# Patient Record
Sex: Female | Born: 1945
Health system: Southern US, Community
[De-identification: ages and names within clinical notes are randomized; demographics above are authoritative.]

## PROBLEM LIST (undated history)

## (undated) DIAGNOSIS — I10 Essential (primary) hypertension: Secondary | ICD-10-CM

## (undated) DIAGNOSIS — C50919 Malignant neoplasm of unspecified site of unspecified female breast: Secondary | ICD-10-CM

## (undated) DIAGNOSIS — M810 Age-related osteoporosis without current pathological fracture: Secondary | ICD-10-CM

## (undated) HISTORY — DX: Essential (primary) hypertension: I10

## (undated) HISTORY — PX: EYE SURGERY: SHX253

## (undated) HISTORY — DX: Age-related osteoporosis without current pathological fracture: M81.0

## (undated) HISTORY — PX: COLONOSCOPY: SHX174

## (undated) HISTORY — PX: TUBAL LIGATION: SHX77

---

## 1995-08-09 DIAGNOSIS — C50919 Malignant neoplasm of unspecified site of unspecified female breast: Secondary | ICD-10-CM

## 1995-08-09 HISTORY — DX: Malignant neoplasm of unspecified site of unspecified female breast: C50.919

## 1995-08-09 HISTORY — PX: MASTECTOMY: SHX3

## 1998-08-05 ENCOUNTER — Other Ambulatory Visit: Admission: RE | Admit: 1998-08-05 | Discharge: 1998-08-05 | Payer: Self-pay | Admitting: Obstetrics and Gynecology

## 1999-02-05 ENCOUNTER — Encounter: Payer: Self-pay | Admitting: Family Medicine

## 1999-02-05 ENCOUNTER — Ambulatory Visit (HOSPITAL_COMMUNITY): Admission: RE | Admit: 1999-02-05 | Discharge: 1999-02-05 | Payer: Self-pay | Admitting: Family Medicine

## 1999-09-28 ENCOUNTER — Encounter: Admission: RE | Admit: 1999-09-28 | Discharge: 1999-09-28 | Payer: Self-pay | Admitting: Family Medicine

## 1999-09-28 ENCOUNTER — Encounter: Payer: Self-pay | Admitting: Family Medicine

## 1999-10-14 ENCOUNTER — Other Ambulatory Visit: Admission: RE | Admit: 1999-10-14 | Discharge: 1999-10-14 | Payer: Self-pay | Admitting: Obstetrics and Gynecology

## 1999-10-14 ENCOUNTER — Encounter (INDEPENDENT_AMBULATORY_CARE_PROVIDER_SITE_OTHER): Payer: Self-pay | Admitting: Specialist

## 2000-07-13 ENCOUNTER — Encounter (INDEPENDENT_AMBULATORY_CARE_PROVIDER_SITE_OTHER): Payer: Self-pay

## 2000-07-13 ENCOUNTER — Ambulatory Visit (HOSPITAL_COMMUNITY): Admission: RE | Admit: 2000-07-13 | Discharge: 2000-07-13 | Payer: Self-pay | Admitting: Obstetrics and Gynecology

## 2000-10-23 ENCOUNTER — Encounter: Payer: Self-pay | Admitting: Oncology

## 2000-10-23 ENCOUNTER — Encounter: Admission: RE | Admit: 2000-10-23 | Discharge: 2000-10-23 | Payer: Self-pay | Admitting: Oncology

## 2000-12-11 ENCOUNTER — Encounter (INDEPENDENT_AMBULATORY_CARE_PROVIDER_SITE_OTHER): Payer: Self-pay | Admitting: Specialist

## 2000-12-11 ENCOUNTER — Other Ambulatory Visit: Admission: RE | Admit: 2000-12-11 | Discharge: 2000-12-11 | Payer: Self-pay | Admitting: Obstetrics and Gynecology

## 2001-07-10 ENCOUNTER — Other Ambulatory Visit: Admission: RE | Admit: 2001-07-10 | Discharge: 2001-07-10 | Payer: Self-pay | Admitting: Obstetrics and Gynecology

## 2001-10-29 ENCOUNTER — Encounter: Payer: Self-pay | Admitting: Obstetrics and Gynecology

## 2001-10-29 ENCOUNTER — Encounter: Admission: RE | Admit: 2001-10-29 | Discharge: 2001-10-29 | Payer: Self-pay | Admitting: Obstetrics and Gynecology

## 2002-01-08 ENCOUNTER — Other Ambulatory Visit: Admission: RE | Admit: 2002-01-08 | Discharge: 2002-01-08 | Payer: Self-pay | Admitting: Obstetrics and Gynecology

## 2002-07-11 ENCOUNTER — Other Ambulatory Visit: Admission: RE | Admit: 2002-07-11 | Discharge: 2002-07-11 | Payer: Self-pay | Admitting: Obstetrics and Gynecology

## 2002-11-11 ENCOUNTER — Encounter: Payer: Self-pay | Admitting: Obstetrics and Gynecology

## 2002-11-11 ENCOUNTER — Encounter: Admission: RE | Admit: 2002-11-11 | Discharge: 2002-11-11 | Payer: Self-pay | Admitting: Obstetrics and Gynecology

## 2003-01-13 ENCOUNTER — Other Ambulatory Visit: Admission: RE | Admit: 2003-01-13 | Discharge: 2003-01-13 | Payer: Self-pay | Admitting: Obstetrics and Gynecology

## 2003-11-24 ENCOUNTER — Encounter: Admission: RE | Admit: 2003-11-24 | Discharge: 2003-11-24 | Payer: Self-pay | Admitting: Family Medicine

## 2004-02-17 ENCOUNTER — Other Ambulatory Visit: Admission: RE | Admit: 2004-02-17 | Discharge: 2004-02-17 | Payer: Self-pay | Admitting: Obstetrics and Gynecology

## 2004-12-27 ENCOUNTER — Encounter: Admission: RE | Admit: 2004-12-27 | Discharge: 2004-12-27 | Payer: Self-pay | Admitting: Obstetrics and Gynecology

## 2005-02-21 ENCOUNTER — Other Ambulatory Visit: Admission: RE | Admit: 2005-02-21 | Discharge: 2005-02-21 | Payer: Self-pay | Admitting: Obstetrics and Gynecology

## 2006-01-09 ENCOUNTER — Encounter: Admission: RE | Admit: 2006-01-09 | Discharge: 2006-01-09 | Payer: Self-pay | Admitting: Obstetrics and Gynecology

## 2007-02-13 ENCOUNTER — Encounter: Admission: RE | Admit: 2007-02-13 | Discharge: 2007-02-13 | Payer: Self-pay | Admitting: Obstetrics and Gynecology

## 2008-03-25 ENCOUNTER — Encounter: Admission: RE | Admit: 2008-03-25 | Discharge: 2008-03-25 | Payer: Self-pay | Admitting: Obstetrics and Gynecology

## 2009-06-08 ENCOUNTER — Encounter: Admission: RE | Admit: 2009-06-08 | Discharge: 2009-06-08 | Payer: Self-pay | Admitting: Obstetrics and Gynecology

## 2009-08-08 HISTORY — PX: VAGINAL HYSTERECTOMY: SUR661

## 2009-10-09 ENCOUNTER — Ambulatory Visit (HOSPITAL_COMMUNITY): Admission: RE | Admit: 2009-10-09 | Discharge: 2009-10-09 | Payer: Self-pay | Admitting: Endocrinology

## 2010-01-20 ENCOUNTER — Encounter (INDEPENDENT_AMBULATORY_CARE_PROVIDER_SITE_OTHER): Payer: Self-pay | Admitting: Obstetrics and Gynecology

## 2010-01-20 ENCOUNTER — Ambulatory Visit (HOSPITAL_COMMUNITY): Admission: RE | Admit: 2010-01-20 | Discharge: 2010-01-22 | Payer: Self-pay | Admitting: Obstetrics and Gynecology

## 2010-01-25 ENCOUNTER — Inpatient Hospital Stay (HOSPITAL_COMMUNITY): Admission: AD | Admit: 2010-01-25 | Discharge: 2010-01-25 | Payer: Self-pay | Admitting: Obstetrics and Gynecology

## 2010-07-14 ENCOUNTER — Encounter
Admission: RE | Admit: 2010-07-14 | Discharge: 2010-07-14 | Payer: Self-pay | Source: Home / Self Care | Admitting: Obstetrics and Gynecology

## 2010-10-25 LAB — CBC
HCT: 32.3 % — ABNORMAL LOW (ref 36.0–46.0)
HCT: 38.9 % (ref 36.0–46.0)
Hemoglobin: 13.4 g/dL (ref 12.0–15.0)
Platelets: 184 10*3/uL (ref 150–400)
RBC: 3.6 MIL/uL — ABNORMAL LOW (ref 3.87–5.11)
WBC: 15.9 10*3/uL — ABNORMAL HIGH (ref 4.0–10.5)
WBC: 6.2 10*3/uL (ref 4.0–10.5)

## 2010-10-25 LAB — BASIC METABOLIC PANEL
BUN: 16 mg/dL (ref 6–23)
Chloride: 105 mEq/L (ref 96–112)
Glucose, Bld: 79 mg/dL (ref 70–99)
Potassium: 3.8 mEq/L (ref 3.5–5.1)

## 2010-12-24 NOTE — Op Note (Signed)
[facility]  Patient:    Katherine Stout, Katherine Stout                     MRN: [id number] Proc. Date: 07/13/00 Attending:  Juluis Mire, M.D.                           Operative Report  PREOPERATIVE DIAGNOSIS:       Endometrial polyp.  POSTOPERATIVE DIAGNOSIS:      Endometrial polyp.  OPERATIONS:                   1. Hysteroscopic resection.                               2. Dilatation and curettage.  SURGEON:                      Juluis Mire, M.D.  ANESTHESIA:                   LMA.  ESTIMATED BLOOD LOSS:         Minimal.  PACKS AND DRAINS:             None.  INTRAOPERATIVE BLOOD REPLACEMENT:                  None.  COMPLICATIONS:                None.  INDICATIONS:                  As dictated in the history and physical.  DESCRIPTION OF PROCEDURE:     The patient was taken to the OR and placed in the supine position.  After a satisfactory level of LMA anesthesia was obtained, the patient was placed in the dorsolithotomy position using the [employer] stirrups.  The perineum and vagina were prepped out with Betadine and draped as a sterile field.  Exam under anesthesia revealed the uterus to be posterior and normal size and shape.  The speculum was placed in the vaginal vault.  The cervix was grasped with a single-tooth tenaculum.  The uterus was sounded to 3 cm.  The cervix was dilated to a size 35 Pratt dilator.  The operative resectoscope was introduced.  Visualization revealed a large polyp extending from the right cornual area of the uterus.  This was resected in several steps and sent for pathological review.  We then obtained endometrial biopsies using the resectoscope both anterior and posterior and these were sent for pathological review.  No active bleeding was noted.  We then subsequently did endometrial curettings that were sent.  At the end of the procedure there was no active bleeding and no evidence of perforation or complications.   The hysteroscope, single-tooth tenaculum, and speculum were then removed.  The patient was taken out of the dorsosupine position once extubated and transferred to the recovery room in good condition.  The sponge, needle, and instrument counts were correct by the circulating nurse. DD:  07/13/00 TD:  07/13/00 Job: [id number]

## 2011-07-26 ENCOUNTER — Other Ambulatory Visit: Payer: Self-pay | Admitting: Obstetrics and Gynecology

## 2011-07-26 DIAGNOSIS — Z9011 Acquired absence of right breast and nipple: Secondary | ICD-10-CM

## 2011-07-26 DIAGNOSIS — Z1231 Encounter for screening mammogram for malignant neoplasm of breast: Secondary | ICD-10-CM

## 2011-09-07 ENCOUNTER — Ambulatory Visit
Admission: RE | Admit: 2011-09-07 | Discharge: 2011-09-07 | Disposition: A | Discharge status: Home / Self Care | Payer: Medicare Other | Source: Ambulatory Visit | Attending: Obstetrics and Gynecology | Admitting: Obstetrics and Gynecology

## 2011-09-07 DIAGNOSIS — Z1231 Encounter for screening mammogram for malignant neoplasm of breast: Secondary | ICD-10-CM

## 2011-09-07 DIAGNOSIS — Z9011 Acquired absence of right breast and nipple: Secondary | ICD-10-CM

## 2012-09-06 ENCOUNTER — Other Ambulatory Visit: Payer: Self-pay | Admitting: Obstetrics and Gynecology

## 2012-09-06 DIAGNOSIS — Z9011 Acquired absence of right breast and nipple: Secondary | ICD-10-CM

## 2012-09-06 DIAGNOSIS — Z1231 Encounter for screening mammogram for malignant neoplasm of breast: Secondary | ICD-10-CM

## 2012-10-11 ENCOUNTER — Ambulatory Visit: Payer: Medicare Other

## 2012-11-29 ENCOUNTER — Ambulatory Visit
Admission: RE | Admit: 2012-11-29 | Discharge: 2012-11-29 | Disposition: A | Discharge status: Home / Self Care | Payer: Medicare Other | Source: Ambulatory Visit | Attending: Obstetrics and Gynecology | Admitting: Obstetrics and Gynecology

## 2012-11-29 DIAGNOSIS — Z1231 Encounter for screening mammogram for malignant neoplasm of breast: Secondary | ICD-10-CM

## 2012-11-29 DIAGNOSIS — Z9011 Acquired absence of right breast and nipple: Secondary | ICD-10-CM

## 2013-01-04 ENCOUNTER — Encounter: Payer: Self-pay | Admitting: Gastroenterology

## 2013-11-18 ENCOUNTER — Other Ambulatory Visit: Payer: Self-pay

## 2013-11-18 DIAGNOSIS — Z1231 Encounter for screening mammogram for malignant neoplasm of breast: Secondary | ICD-10-CM

## 2013-11-18 DIAGNOSIS — Z9011 Acquired absence of right breast and nipple: Secondary | ICD-10-CM

## 2013-12-10 ENCOUNTER — Encounter (INDEPENDENT_AMBULATORY_CARE_PROVIDER_SITE_OTHER): Payer: Self-pay

## 2013-12-10 ENCOUNTER — Other Ambulatory Visit: Payer: Self-pay | Admitting: Obstetrics and Gynecology

## 2013-12-10 ENCOUNTER — Ambulatory Visit
Admission: RE | Admit: 2013-12-10 | Discharge: 2013-12-10 | Disposition: A | Discharge status: Home / Self Care | Payer: Medicare Other | Source: Ambulatory Visit

## 2013-12-10 DIAGNOSIS — R928 Other abnormal and inconclusive findings on diagnostic imaging of breast: Secondary | ICD-10-CM

## 2013-12-10 DIAGNOSIS — Z9011 Acquired absence of right breast and nipple: Secondary | ICD-10-CM

## 2013-12-10 DIAGNOSIS — Z1231 Encounter for screening mammogram for malignant neoplasm of breast: Secondary | ICD-10-CM

## 2013-12-23 ENCOUNTER — Ambulatory Visit
Admission: RE | Admit: 2013-12-23 | Discharge: 2013-12-23 | Disposition: A | Discharge status: Home / Self Care | Payer: Medicare Other | Source: Ambulatory Visit | Attending: Obstetrics and Gynecology | Admitting: Obstetrics and Gynecology

## 2013-12-23 DIAGNOSIS — R928 Other abnormal and inconclusive findings on diagnostic imaging of breast: Secondary | ICD-10-CM

## 2014-03-05 ENCOUNTER — Encounter: Payer: Self-pay | Admitting: Gastroenterology

## 2014-09-27 ENCOUNTER — Encounter: Payer: Self-pay | Admitting: Gastroenterology

## 2014-12-04 ENCOUNTER — Other Ambulatory Visit: Payer: Self-pay

## 2014-12-04 DIAGNOSIS — Z1231 Encounter for screening mammogram for malignant neoplasm of breast: Secondary | ICD-10-CM

## 2014-12-11 ENCOUNTER — Other Ambulatory Visit (HOSPITAL_COMMUNITY): Payer: Self-pay | Admitting: Obstetrics and Gynecology

## 2014-12-15 LAB — CYTOLOGY - PAP

## 2015-01-01 ENCOUNTER — Ambulatory Visit
Admission: RE | Admit: 2015-01-01 | Discharge: 2015-01-01 | Disposition: A | Discharge status: Home / Self Care | Payer: Medicare Other | Source: Ambulatory Visit

## 2015-01-01 DIAGNOSIS — Z1231 Encounter for screening mammogram for malignant neoplasm of breast: Secondary | ICD-10-CM

## 2015-12-11 ENCOUNTER — Other Ambulatory Visit: Payer: Self-pay

## 2015-12-11 DIAGNOSIS — Z1231 Encounter for screening mammogram for malignant neoplasm of breast: Secondary | ICD-10-CM

## 2016-01-19 ENCOUNTER — Ambulatory Visit
Admission: RE | Admit: 2016-01-19 | Discharge: 2016-01-19 | Disposition: A | Discharge status: Home / Self Care | Payer: Medicare Other | Source: Ambulatory Visit

## 2016-01-19 DIAGNOSIS — Z1231 Encounter for screening mammogram for malignant neoplasm of breast: Secondary | ICD-10-CM

## 2016-01-21 ENCOUNTER — Other Ambulatory Visit: Payer: Self-pay | Admitting: Family Medicine

## 2016-01-21 DIAGNOSIS — R928 Other abnormal and inconclusive findings on diagnostic imaging of breast: Secondary | ICD-10-CM

## 2016-02-04 ENCOUNTER — Ambulatory Visit
Admission: RE | Admit: 2016-02-04 | Discharge: 2016-02-04 | Disposition: A | Discharge status: Home / Self Care | Payer: Medicare Other | Source: Ambulatory Visit | Attending: Family Medicine | Admitting: Family Medicine

## 2016-02-04 DIAGNOSIS — R928 Other abnormal and inconclusive findings on diagnostic imaging of breast: Secondary | ICD-10-CM

## 2017-01-26 ENCOUNTER — Other Ambulatory Visit: Payer: Self-pay | Admitting: Obstetrics and Gynecology

## 2017-01-26 DIAGNOSIS — R928 Other abnormal and inconclusive findings on diagnostic imaging of breast: Secondary | ICD-10-CM

## 2017-01-31 ENCOUNTER — Ambulatory Visit
Admission: RE | Admit: 2017-01-31 | Discharge: 2017-01-31 | Disposition: A | Discharge status: Home / Self Care | Payer: Medicare Other | Source: Ambulatory Visit | Attending: Obstetrics and Gynecology | Admitting: Obstetrics and Gynecology

## 2017-01-31 ENCOUNTER — Other Ambulatory Visit: Payer: Self-pay | Admitting: Obstetrics and Gynecology

## 2017-01-31 DIAGNOSIS — R928 Other abnormal and inconclusive findings on diagnostic imaging of breast: Secondary | ICD-10-CM

## 2017-01-31 DIAGNOSIS — N6320 Unspecified lump in the left breast, unspecified quadrant: Secondary | ICD-10-CM

## 2017-01-31 HISTORY — DX: Malignant neoplasm of unspecified site of unspecified female breast: C50.919

## 2017-02-13 ENCOUNTER — Other Ambulatory Visit: Payer: Self-pay | Admitting: Obstetrics and Gynecology

## 2017-02-13 ENCOUNTER — Ambulatory Visit
Admission: RE | Admit: 2017-02-13 | Discharge: 2017-02-13 | Disposition: A | Discharge status: Home / Self Care | Payer: Medicare Other | Source: Ambulatory Visit | Attending: Obstetrics and Gynecology | Admitting: Obstetrics and Gynecology

## 2017-02-13 DIAGNOSIS — N6002 Solitary cyst of left breast: Secondary | ICD-10-CM

## 2017-02-13 DIAGNOSIS — N6320 Unspecified lump in the left breast, unspecified quadrant: Secondary | ICD-10-CM

## 2017-02-13 HISTORY — PX: BREAST CYST ASPIRATION: SHX578

## 2017-07-24 ENCOUNTER — Other Ambulatory Visit: Payer: Self-pay | Admitting: Family Medicine

## 2017-07-24 ENCOUNTER — Other Ambulatory Visit: Payer: Self-pay | Admitting: Obstetrics and Gynecology

## 2017-07-24 DIAGNOSIS — N6320 Unspecified lump in the left breast, unspecified quadrant: Secondary | ICD-10-CM

## 2017-08-14 ENCOUNTER — Ambulatory Visit
Admission: RE | Admit: 2017-08-14 | Discharge: 2017-08-14 | Disposition: A | Discharge status: Home / Self Care | Payer: Medicare Other | Source: Ambulatory Visit | Attending: Obstetrics and Gynecology | Admitting: Obstetrics and Gynecology

## 2017-08-14 DIAGNOSIS — N6320 Unspecified lump in the left breast, unspecified quadrant: Secondary | ICD-10-CM

## 2018-01-19 ENCOUNTER — Other Ambulatory Visit: Payer: Self-pay | Admitting: Family Medicine

## 2018-01-19 DIAGNOSIS — Z1231 Encounter for screening mammogram for malignant neoplasm of breast: Secondary | ICD-10-CM

## 2018-02-19 ENCOUNTER — Encounter: Payer: Self-pay | Admitting: Obstetrics and Gynecology

## 2018-02-20 ENCOUNTER — Ambulatory Visit
Admission: RE | Admit: 2018-02-20 | Discharge: 2018-02-20 | Disposition: A | Discharge status: Home / Self Care | Payer: Medicare Other | Source: Ambulatory Visit | Attending: Family Medicine | Admitting: Family Medicine

## 2018-02-20 DIAGNOSIS — Z1231 Encounter for screening mammogram for malignant neoplasm of breast: Secondary | ICD-10-CM

## 2018-02-21 ENCOUNTER — Other Ambulatory Visit: Payer: Self-pay | Admitting: Family Medicine

## 2018-02-21 DIAGNOSIS — R928 Other abnormal and inconclusive findings on diagnostic imaging of breast: Secondary | ICD-10-CM

## 2018-02-22 ENCOUNTER — Other Ambulatory Visit: Payer: Self-pay | Admitting: Family Medicine

## 2018-02-22 DIAGNOSIS — R928 Other abnormal and inconclusive findings on diagnostic imaging of breast: Secondary | ICD-10-CM

## 2018-02-23 ENCOUNTER — Other Ambulatory Visit: Payer: Self-pay | Admitting: Family Medicine

## 2018-02-23 ENCOUNTER — Ambulatory Visit
Admission: RE | Admit: 2018-02-23 | Discharge: 2018-02-23 | Disposition: A | Discharge status: Home / Self Care | Payer: Medicare Other | Source: Ambulatory Visit | Attending: Family Medicine | Admitting: Family Medicine

## 2018-02-23 DIAGNOSIS — R928 Other abnormal and inconclusive findings on diagnostic imaging of breast: Secondary | ICD-10-CM

## 2018-02-23 DIAGNOSIS — N6320 Unspecified lump in the left breast, unspecified quadrant: Secondary | ICD-10-CM

## 2018-02-26 ENCOUNTER — Other Ambulatory Visit: Payer: Self-pay | Admitting: Family Medicine

## 2018-03-29 ENCOUNTER — Encounter: Payer: Self-pay | Admitting: Obstetrics and Gynecology

## 2018-04-16 ENCOUNTER — Telehealth: Payer: Self-pay | Admitting: Gastroenterology

## 2018-04-16 NOTE — Telephone Encounter (Signed)
OK 

## 2018-04-17 NOTE — Telephone Encounter (Signed)
Spoke w/pt.  She is wanting to schedule in November and will call back to schedule

## 2018-05-02 ENCOUNTER — Encounter: Payer: Self-pay | Admitting: Gastroenterology

## 2018-06-04 ENCOUNTER — Ambulatory Visit (AMBULATORY_SURGERY_CENTER): Payer: Self-pay

## 2018-06-04 ENCOUNTER — Encounter: Payer: Self-pay | Admitting: Gastroenterology

## 2018-06-04 VITALS — Ht 65.5 in | Wt 148.4 lb

## 2018-06-04 DIAGNOSIS — Z1211 Encounter for screening for malignant neoplasm of colon: Secondary | ICD-10-CM

## 2018-06-04 MED ORDER — PEG 3350-KCL-NA BICARB-NACL 420 G PO SOLR: 4000.0000 mL | Freq: Once | ORAL | 0 refills | Status: AC

## 2018-06-04 NOTE — Progress Notes (Signed)
Denies allergies to eggs or soy products. Denies complication of anesthesia or sedation. Denies use of weight loss medication. Denies use of O2.   Emmi instructions declined.  

## 2018-06-18 ENCOUNTER — Encounter: Payer: Medicare Other | Admitting: Gastroenterology

## 2018-07-23 ENCOUNTER — Encounter: Payer: Self-pay | Admitting: *Deleted

## 2018-07-25 ENCOUNTER — Encounter: Payer: Self-pay | Admitting: Gastroenterology

## 2018-07-25 ENCOUNTER — Ambulatory Visit (AMBULATORY_SURGERY_CENTER): Payer: Medicare Other | Admitting: Gastroenterology

## 2018-07-25 VITALS — BP 152/80 | HR 66 | Temp 96.8°F | Resp 17 | Ht 65.5 in | Wt 148.0 lb

## 2018-07-25 DIAGNOSIS — Z1211 Encounter for screening for malignant neoplasm of colon: Secondary | ICD-10-CM | POA: Diagnosis present

## 2018-07-25 MED ORDER — SODIUM CHLORIDE 0.9 % IV SOLN: 500.0000 mL | Freq: Once | INTRAVENOUS | Status: DC

## 2018-07-25 NOTE — Progress Notes (Signed)
Report given to PACU, vss 

## 2018-07-25 NOTE — Op Note (Signed)
[place] Patient Name: Katherine Stout Procedure Date: 07/25/2018 2:12 PM MRN: [id number] Endoscopist: Ladene Artist , MD Age: 72 Referring MD:  Date of Birth: [birth year] Gender: Female Account #: [id number] Procedure:                Colonoscopy Indications:              Screening for colorectal malignant neoplasm Medicines:                Monitored Anesthesia Care Procedure:                Pre-Anesthesia Assessment:                           - Prior to the procedure, a History and Physical                            was performed, and patient medications and                            allergies were reviewed. The patient's tolerance of                            previous anesthesia was also reviewed. The risks                            and benefits of the procedure and the sedation                            options and risks were discussed with the patient.                            All questions were answered, and informed consent                            was obtained. Prior Anticoagulants: The patient has                            taken no previous anticoagulant or antiplatelet                            agents. ASA Grade Assessment: II - A patient with                            mild systemic disease. After reviewing the risks                            and benefits, the patient was deemed in                            satisfactory condition to undergo the procedure.                           After obtaining informed consent, the colonoscope  was passed under direct vision. Throughout the                            procedure, the patient's blood pressure, pulse, and                            oxygen saturations were monitored continuously. The                            Model PCF-[id number]) scope was introduced                            through the anus and advanced to the the cecum,                            identified  by appendiceal orifice and ileocecal                            valve. The ileocecal valve, appendiceal orifice,                            and rectum were photographed. The quality of the                            bowel preparation was good. The colonoscopy was                            performed without difficulty. The patient tolerated                            the procedure well. Scope In: 2:26:29 PM Scope Out: 2:44:03 PM Scope Withdrawal Time: 0 hours 13 minutes 35 seconds  Total Procedure Duration: 0 hours 17 minutes 34 seconds  Findings:                 The perianal and digital rectal examinations were                            normal except for small external tags.                           Multiple small-mouthed diverticula were found in                            the left colon. There was no evidence of                            diverticular bleeding.                           Internal hemorrhoids were found during                            retroflexion. The hemorrhoids were small and Grade  I (internal hemorrhoids that do not prolapse).                           The exam was otherwise without abnormality on                            direct and retroflexion views. Complications:            No immediate complications. Estimated blood loss:                            None. Estimated Blood Loss:     Estimated blood loss: none. Impression:               - Mild diverticulosis in the left colon. There was                            no evidence of diverticular bleeding.                           - Internal hemorrhoids.                           - The examination was otherwise normal on direct                            and retroflexion views.                           - No specimens collected. Recommendation:           - Patient has a contact number available for                            emergencies. The signs and symptoms of potential                             delayed complications were discussed with the                            patient. Return to normal activities tomorrow.                            Written discharge instructions were provided to the                            patient.                           - High fiber diet.                           - Continue present medications.                           - No repeat colonoscopy due to age. Ladene Artist, MD 07/25/2018 2:55:01 PM This report has been signed electronically.

## 2018-07-25 NOTE — Patient Instructions (Signed)
YOU HAD AN ENDOSCOPIC PROCEDURE TODAY AT [facility]:   Refer to the procedure report that was given to you for any specific questions about what was found during the examination.  If the procedure report does not answer your questions, please call your gastroenterologist to clarify.  If you requested that your care partner not be given the details of your procedure findings, then the procedure report has been included in a sealed envelope for you to review at your convenience later.  YOU SHOULD EXPECT: Some feelings of bloating in the abdomen. Passage of more gas than usual.  Walking can help get rid of the air that was put into your GI tract during the procedure and reduce the bloating. If you had a lower endoscopy (such as a colonoscopy or flexible sigmoidoscopy) you may notice spotting of blood in your stool or on the toilet paper. If you underwent a bowel prep for your procedure, you may not have a normal bowel movement for a few days.  Please Note:  You might notice some irritation and congestion in your nose or some drainage.  This is from the oxygen used during your procedure.  There is no need for concern and it should clear up in a day or so.  SYMPTOMS TO REPORT IMMEDIATELY:   Following lower endoscopy (colonoscopy or flexible sigmoidoscopy):  Excessive amounts of blood in the stool  Significant tenderness or worsening of abdominal pains  Swelling of the abdomen that is new, acute  Fever of 100F or higher   For urgent or emergent issues, a gastroenterologist can be reached at any hour by calling [phone].   DIET:  We do recommend a small meal at first, but then you may proceed to your regular diet.  Drink plenty of fluids but you should avoid alcoholic beverages for 24 hours.  ACTIVITY:  You should plan to take it easy for the rest of today and you should NOT DRIVE or use heavy machinery until tomorrow (because of the sedation medicines used during the test).     FOLLOW UP: Our staff will call the number listed on your records the next business day following your procedure to check on you and address any questions or concerns that you may have regarding the information given to you following your procedure. If we do not reach you, we will leave a message.  However, if you are feeling well and you are not experiencing any problems, there is no need to return our call.  We will assume that you have returned to your regular daily activities without incident.   SIGNATURES/CONFIDENTIALITY: You and/or your care partner have signed paperwork which will be entered into your electronic medical record.  These signatures attest to the fact that that the information above on your After Visit Summary has been reviewed and is understood.  Full responsibility of the confidentiality of this discharge information lies with you and/or your care-partner.  Read all handouts given to you by your recovery room nurse.

## 2018-07-26 ENCOUNTER — Telehealth: Payer: Self-pay

## 2018-07-26 NOTE — Telephone Encounter (Signed)
  Follow up Call-  Call back number 07/25/2018  Post procedure Call Back phone  # [phone]  Permission to leave phone message Yes  Some recent data might be hidden     Patient questions:  Do you have a fever, pain , or abdominal swelling? No. Pain Score  0 *  Have you tolerated food without any problems? Yes.    Have you been able to return to your normal activities? Yes.    Do you have any questions about your discharge instructions: Diet   No. Medications  No. Follow up visit  No.  Do you have questions or concerns about your Care? No.  Actions: * If pain score is 4 or above: No action needed, pain <4.

## 2018-08-30 ENCOUNTER — Other Ambulatory Visit: Payer: Self-pay | Admitting: Family Medicine

## 2018-08-30 ENCOUNTER — Ambulatory Visit
Admission: RE | Admit: 2018-08-30 | Discharge: 2018-08-30 | Disposition: A | Discharge status: Home / Self Care | Payer: Medicare Other | Source: Ambulatory Visit | Attending: Family Medicine | Admitting: Family Medicine

## 2018-08-30 DIAGNOSIS — N6320 Unspecified lump in the left breast, unspecified quadrant: Secondary | ICD-10-CM

## 2019-03-12 ENCOUNTER — Other Ambulatory Visit: Payer: Medicare Other

## 2019-03-15 ENCOUNTER — Other Ambulatory Visit: Payer: Self-pay

## 2019-03-15 ENCOUNTER — Ambulatory Visit
Admission: RE | Admit: 2019-03-15 | Discharge: 2019-03-15 | Disposition: A | Discharge status: Home / Self Care | Payer: Medicare Other | Source: Ambulatory Visit | Attending: Family Medicine | Admitting: Family Medicine

## 2019-03-15 DIAGNOSIS — N6320 Unspecified lump in the left breast, unspecified quadrant: Secondary | ICD-10-CM

## 2020-03-10 ENCOUNTER — Other Ambulatory Visit: Payer: Self-pay | Admitting: Obstetrics and Gynecology

## 2020-03-10 DIAGNOSIS — Z1231 Encounter for screening mammogram for malignant neoplasm of breast: Secondary | ICD-10-CM

## 2020-04-21 ENCOUNTER — Other Ambulatory Visit: Payer: Self-pay

## 2020-04-21 ENCOUNTER — Ambulatory Visit
Admission: RE | Admit: 2020-04-21 | Discharge: 2020-04-21 | Disposition: A | Discharge status: Home / Self Care | Payer: Medicare Other | Source: Ambulatory Visit | Attending: Obstetrics and Gynecology | Admitting: Obstetrics and Gynecology

## 2020-04-21 DIAGNOSIS — Z1231 Encounter for screening mammogram for malignant neoplasm of breast: Secondary | ICD-10-CM

## 2021-04-27 ENCOUNTER — Other Ambulatory Visit: Payer: Self-pay | Admitting: Obstetrics and Gynecology

## 2021-04-27 DIAGNOSIS — Z1231 Encounter for screening mammogram for malignant neoplasm of breast: Secondary | ICD-10-CM

## 2021-06-03 ENCOUNTER — Ambulatory Visit
Admission: RE | Admit: 2021-06-03 | Discharge: 2021-06-03 | Disposition: A | Discharge status: Home / Self Care | Payer: Medicare Other | Source: Ambulatory Visit | Attending: Obstetrics and Gynecology | Admitting: Obstetrics and Gynecology

## 2021-06-03 ENCOUNTER — Other Ambulatory Visit: Payer: Self-pay

## 2021-06-03 DIAGNOSIS — Z1231 Encounter for screening mammogram for malignant neoplasm of breast: Secondary | ICD-10-CM

## 2021-07-12 DIAGNOSIS — M810 Age-related osteoporosis without current pathological fracture: Secondary | ICD-10-CM | POA: Insufficient documentation

## 2021-07-12 DIAGNOSIS — I10 Essential (primary) hypertension: Secondary | ICD-10-CM | POA: Insufficient documentation

## 2021-07-12 DIAGNOSIS — Z853 Personal history of malignant neoplasm of breast: Secondary | ICD-10-CM | POA: Insufficient documentation

## 2022-05-12 ENCOUNTER — Other Ambulatory Visit: Payer: Self-pay | Admitting: Family Medicine

## 2022-05-12 DIAGNOSIS — Z1231 Encounter for screening mammogram for malignant neoplasm of breast: Secondary | ICD-10-CM

## 2022-06-16 ENCOUNTER — Ambulatory Visit: Payer: Medicare Other

## 2022-08-25 ENCOUNTER — Ambulatory Visit: Payer: Medicare Other

## 2022-09-01 ENCOUNTER — Ambulatory Visit
Admission: RE | Admit: 2022-09-01 | Discharge: 2022-09-01 | Disposition: A | Discharge status: Home / Self Care | Payer: Medicare Other | Source: Ambulatory Visit | Attending: Family Medicine | Admitting: Family Medicine

## 2022-09-01 DIAGNOSIS — Z1231 Encounter for screening mammogram for malignant neoplasm of breast: Secondary | ICD-10-CM

## 2023-04-12 NOTE — Progress Notes (Unsigned)
   Rubin Payor, PhD, LAT, ATC acting as a scribe for Clementeen Graham, MD.  Katherine Stout is a 77 y.o. female who presents to [facility] at [facility] today for osteoporosis management. Currently taking Fosamax. She is curious to understand the change in her T-score given her current treatment.  DEXA scan (date, T-score): 03/14/23 - -3.0 LFN, -2.4 RFN (CURRENT FRACTURE RISK: Lumbar spine - 3.6 TIMES NORMAL Left hip - 5 TIMES NORMAL ) Prior treatment: Alendronate x 3 years History of Hip, Spine, or Wrist Fx: no Heart disease or stroke: no Cancer: breast cancer (right) 1997 with chemo Kidney Disease: no Gastric/Peptic Ulcer: no Gastric bypass surgery: no Severe GERD: no Hx of seizures: no Age at Menopause: 1997 Calcium intake: only multi-vitamin Vitamin D intake: Vit D3 2,000 IU Hormone replacement therapy: no Smoking history: no smoking Alcohol: no Exercise: YES- swimming, yard work, walking Major dental work in past year: yes- braces Parents with hip/spine fracture: no- parents died at the age of 15 and 37 Height loss: 2"   Pertinent review of systems: No fevers or chills  Relevant historical information: Hypertension osteoporosis.   Exam:  BP (!) 150/100   Pulse (!) 105   Ht 5' 5.5" (1.664 m)   Wt 149 lb (67.6 kg)   SpO2 96%   BMI 24.42 kg/m  General: Well Developed, well nourished, and in no acute distress.   MSK: Normal thoracic and lumbar motion.  Normal gait.     Assessment and Plan: 77 y.o. female with osteoporosis with a T-score of -3.  She has been on alendronate now for about 3 years.  She tolerates the alendronate well.  She is taking vitamin D and is doing weightbearing exercise most of the days of the week.  The next most efficacious medication for her would probably be an anabolic agent like Evenity or Tymlos.  Both of these medications require injections and are expensive.  After discussion she does not think that she can  give herself a shot every day which rules out Tymlos.  Evenity is a shot every month for 12 months.  There is a black box warning for CVD risk for this medication which we talked about.  She is a little worried about that which is reasonable.  Overall she is doing pretty well with alendronate and weightbearing exercise and vitamin D.  I think that would be perfectly fine to continue for total of 5 years.  She will do some thinking and reading about Evenity with a handout I provided and let me know if she would like to pursue authorization for this medication.   PDMP not reviewed this encounter. No orders of the defined types were placed in this encounter.  No orders of the defined types were placed in this encounter.    Discussed warning signs or symptoms. Please see discharge instructions. Patient expresses understanding.   The above documentation has been reviewed and is accurate and complete Clementeen Graham, M.D.

## 2023-04-13 ENCOUNTER — Ambulatory Visit: Payer: Medicare Other | Admitting: Family Medicine

## 2023-04-13 VITALS — BP 150/100 | HR 105 | Ht 65.5 in | Wt 149.0 lb

## 2023-04-13 DIAGNOSIS — M816 Localized osteoporosis [Lequesne]: Secondary | ICD-10-CM

## 2023-04-13 NOTE — Patient Instructions (Signed)
Thank you for coming in today.   Check back as needed 

## 2023-08-23 ENCOUNTER — Other Ambulatory Visit: Payer: Self-pay | Admitting: Obstetrics and Gynecology

## 2023-08-23 DIAGNOSIS — Z1231 Encounter for screening mammogram for malignant neoplasm of breast: Secondary | ICD-10-CM

## 2023-08-25 IMAGING — MG DIGITAL SCREENING UNILAT LEFT W/ TOMO W/ CAD
4 series · 4 of 12 positions shown · non-contrast
Comparison: Previous exam(s).

CLINICAL DATA: Screening.

EXAM:
DIGITAL SCREENING UNILATERAL LEFT MAMMOGRAM WITH CAD AND
TOMOSYNTHESIS
TECHNIQUE: Left screening digital craniocaudal and mediolateral oblique
mammograms were obtained. Left screening digital breast
tomosynthesis was performed. The images were evaluated with
computer-aided detection.

[L MLO synth-2D]
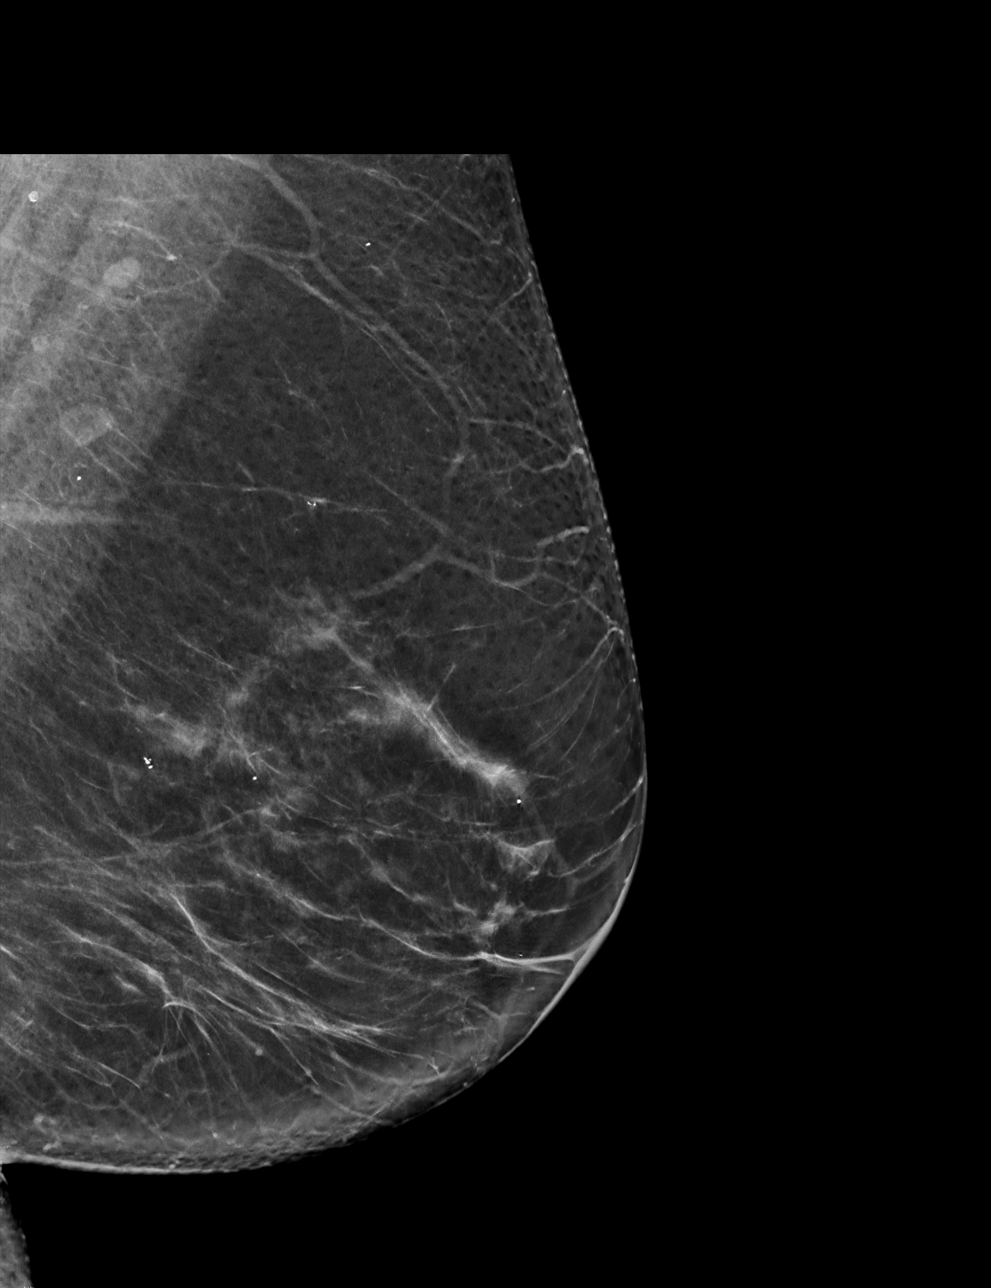

[L CC synth-2D]
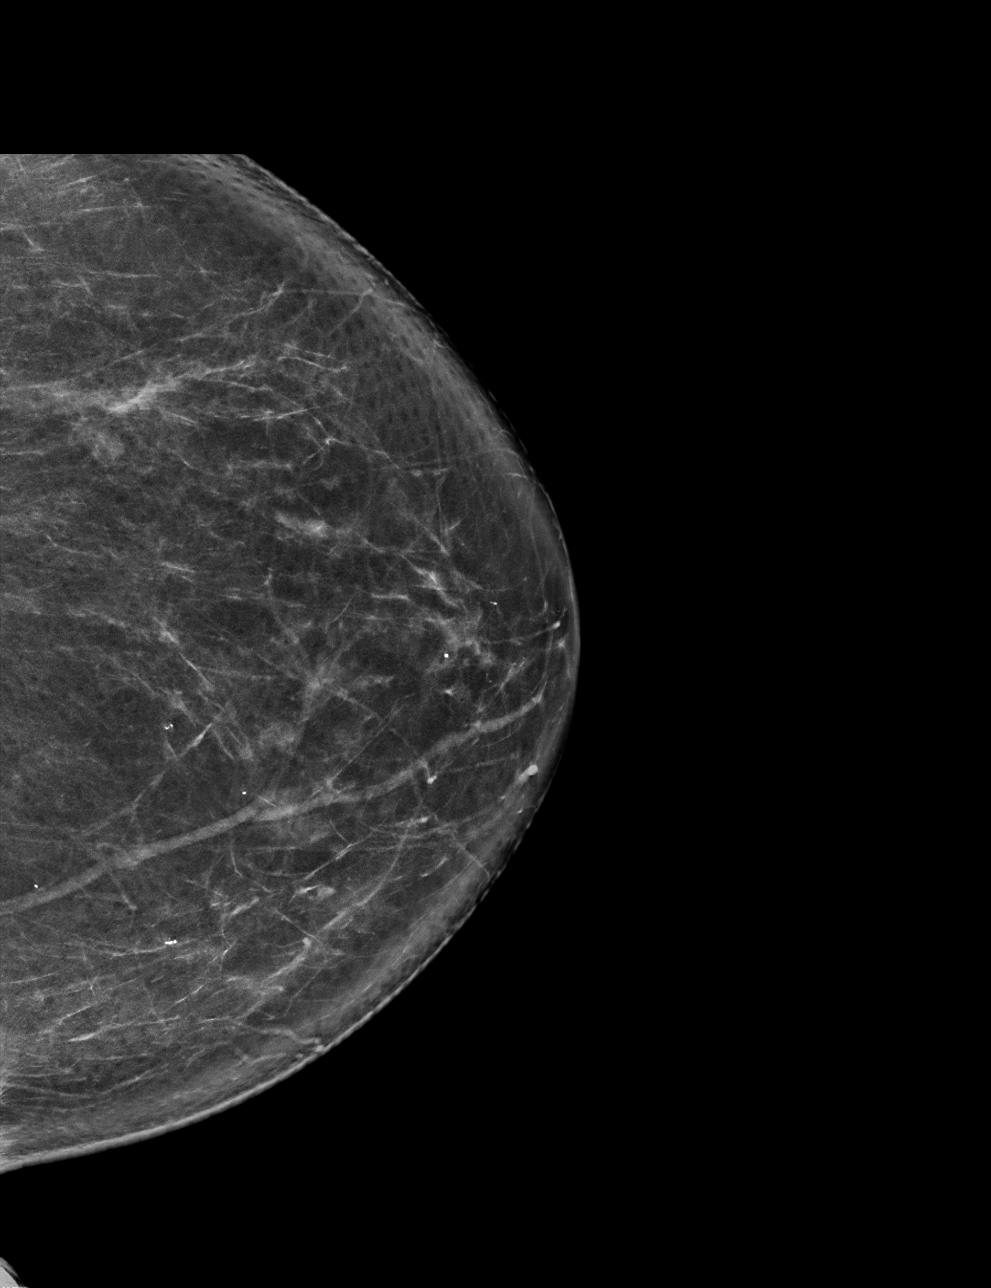

[L CC tomo · tomo slice 39/77.0]
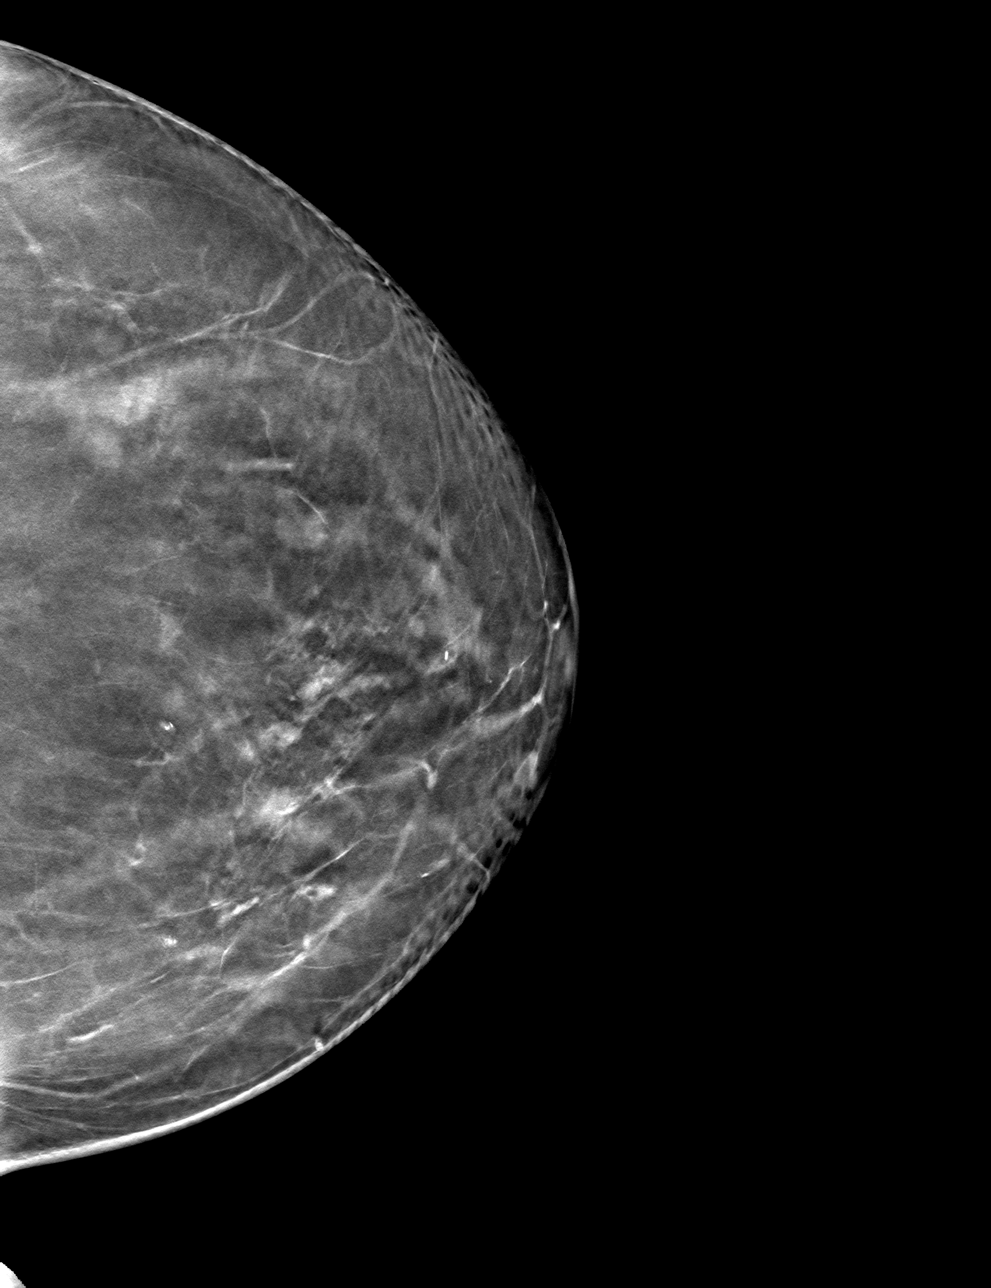

[L MLO tomo · tomo slice 39/78.0]
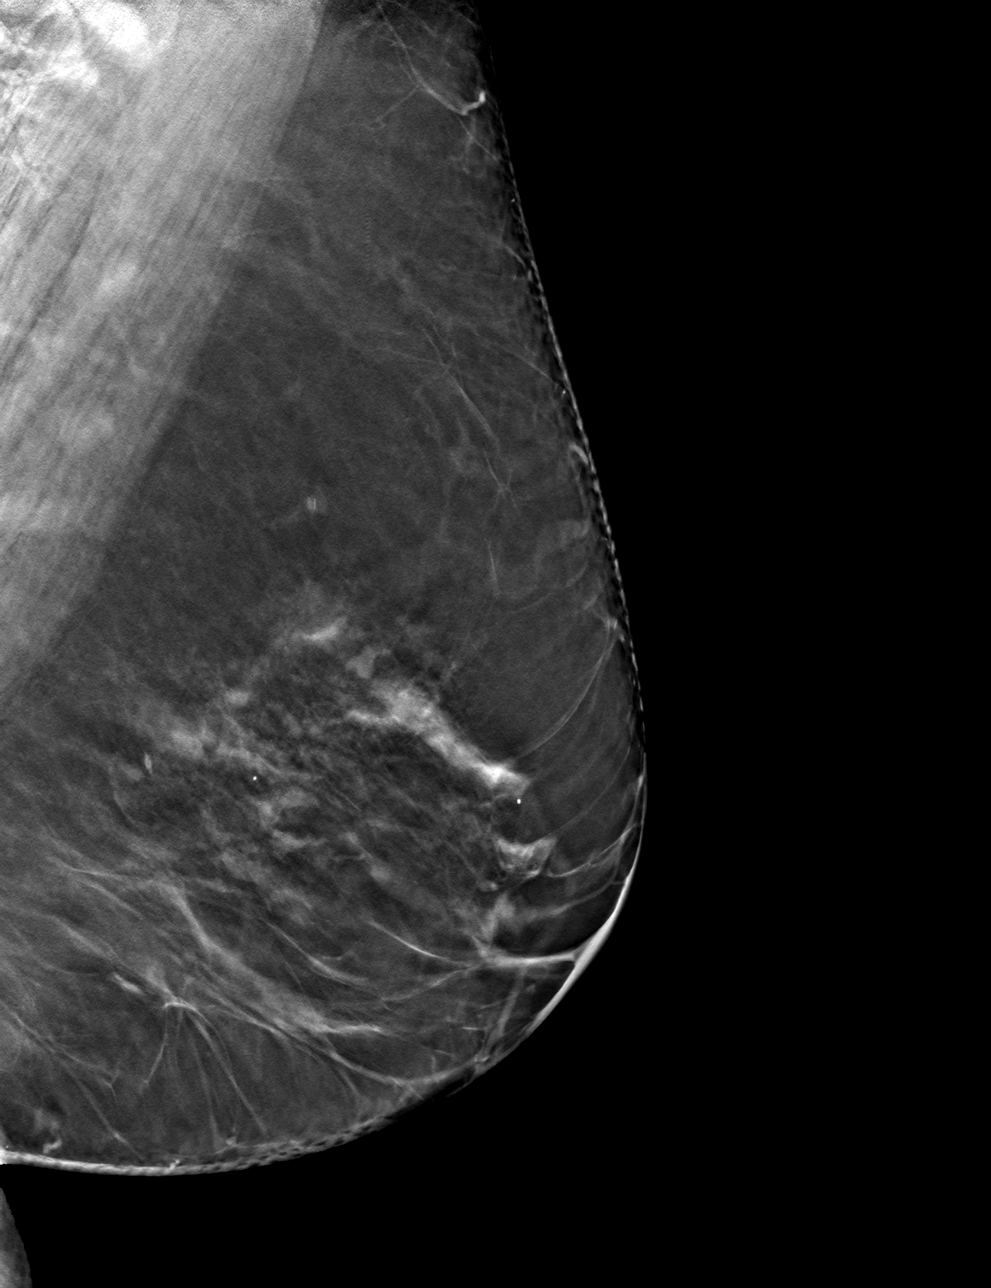

[4 of 12 positions shown; findings below may reference images not displayed]

ACR Breast Density Category b: There are scattered areas of
fibroglandular density.
FINDINGS: The patient has had a right mastectomy. There are no findings
suspicious for malignancy.
IMPRESSION: No mammographic evidence of malignancy. A result letter of this
screening mammogram will be mailed directly to the patient.

RECOMMENDATION:
Screening mammogram in one year.  (Code:QW-1-27W)

BI-RADS CATEGORY  1: Negative.

## 2023-09-14 ENCOUNTER — Ambulatory Visit
Admission: RE | Admit: 2023-09-14 | Discharge: 2023-09-14 | Disposition: A | Discharge status: Home / Self Care | Payer: Medicare Other | Source: Ambulatory Visit | Attending: Obstetrics and Gynecology | Admitting: Obstetrics and Gynecology

## 2023-09-14 DIAGNOSIS — Z1231 Encounter for screening mammogram for malignant neoplasm of breast: Secondary | ICD-10-CM

## 2023-09-19 ENCOUNTER — Other Ambulatory Visit: Payer: Self-pay | Admitting: Obstetrics and Gynecology

## 2023-09-19 DIAGNOSIS — R928 Other abnormal and inconclusive findings on diagnostic imaging of breast: Secondary | ICD-10-CM

## 2023-09-28 ENCOUNTER — Other Ambulatory Visit: Payer: Medicare Other

## 2023-10-07 DIAGNOSIS — Z853 Personal history of malignant neoplasm of breast: Secondary | ICD-10-CM

## 2023-10-07 HISTORY — DX: Personal history of malignant neoplasm of breast: Z85.3

## 2023-10-10 ENCOUNTER — Other Ambulatory Visit: Payer: Self-pay | Admitting: Obstetrics and Gynecology

## 2023-10-10 ENCOUNTER — Ambulatory Visit
Admission: RE | Admit: 2023-10-10 | Discharge: 2023-10-10 | Disposition: A | Discharge status: Home / Self Care | Payer: Medicare Other | Source: Ambulatory Visit | Attending: Obstetrics and Gynecology | Admitting: Obstetrics and Gynecology

## 2023-10-10 DIAGNOSIS — R928 Other abnormal and inconclusive findings on diagnostic imaging of breast: Secondary | ICD-10-CM

## 2023-10-10 DIAGNOSIS — N6320 Unspecified lump in the left breast, unspecified quadrant: Secondary | ICD-10-CM

## 2023-10-13 ENCOUNTER — Ambulatory Visit
Admission: RE | Admit: 2023-10-13 | Discharge: 2023-10-13 | Disposition: A | Discharge status: Home / Self Care | Source: Ambulatory Visit | Attending: Obstetrics and Gynecology | Admitting: Obstetrics and Gynecology

## 2023-10-13 DIAGNOSIS — N6320 Unspecified lump in the left breast, unspecified quadrant: Secondary | ICD-10-CM

## 2023-10-13 HISTORY — PX: BREAST BIOPSY: SHX20

## 2023-10-16 LAB — SURGICAL PATHOLOGY

## 2023-10-17 ENCOUNTER — Encounter: Payer: Self-pay | Admitting: *Deleted

## 2023-10-17 ENCOUNTER — Other Ambulatory Visit: Payer: Self-pay | Admitting: *Deleted

## 2023-10-17 DIAGNOSIS — N6322 Unspecified lump in the left breast, upper inner quadrant: Secondary | ICD-10-CM

## 2023-10-17 NOTE — Progress Notes (Unsigned)
 PATIENT NAVIGATOR PROGRESS NOTE  Name: Katherine Stout Date: 10/17/2023 MRN: [id number]  DOB: [birth year]   Reason for visit:  New patient appt  Comments:  Called and spoke with Katherine Stout regarding new patient appt with Dr Truett Perna, she was former pt of Dr Truett Perna.  She is meeting with surgeon Dr Luisa Hart  on 3/17 and will be out of town the next week. She request new pt appt on 11/09/23  Reviewed directions to building and parking and provided contact number to call with any questions    Time spent counseling/coordinating care: 30-45 minutes

## 2023-10-17 NOTE — Progress Notes (Signed)
 Referral entered

## 2023-10-23 ENCOUNTER — Telehealth: Payer: Self-pay | Admitting: Radiation Oncology

## 2023-10-23 ENCOUNTER — Ambulatory Visit: Payer: Self-pay | Admitting: Surgery

## 2023-10-23 DIAGNOSIS — C50912 Malignant neoplasm of unspecified site of left female breast: Secondary | ICD-10-CM

## 2023-10-23 NOTE — Telephone Encounter (Signed)
 Spoke to pt to schedule Con60 with Basilio Cairo

## 2023-10-25 ENCOUNTER — Other Ambulatory Visit: Payer: Self-pay | Admitting: Surgery

## 2023-10-25 DIAGNOSIS — C50912 Malignant neoplasm of unspecified site of left female breast: Secondary | ICD-10-CM

## 2023-11-03 ENCOUNTER — Encounter (HOSPITAL_COMMUNITY): Payer: Self-pay

## 2023-11-03 NOTE — Pre-Procedure Instructions (Signed)
 Surgical Instructions   Your procedure is scheduled on Tuesday, April 8th. Report to [facility] Main Entrance "A" at 09:30 A.M., then check in with the Admitting office. Any questions or running late day of surgery: call [phone]  Questions prior to your surgery date: call [phone], Monday-Friday, 8am-4pm. If you experience any cold or flu symptoms such as cough, fever, chills, shortness of breath, etc. between now and your scheduled surgery, please notify us at the above number.     Remember:  Do not eat after midnight the night before your surgery   You may drink clear liquids until 08:30 AM the morning of your surgery.   Clear liquids allowed are: Water, Non-Citrus Juices (without pulp), Carbonated Beverages, Clear Tea (no milk, honey, etc.), Black Coffee Only (NO MILK, CREAM OR POWDERED CREAMER of any kind), and Gatorade.    Take these medicines the morning of surgery with A SIP OF WATER  amLODipine (NORVASC)    Follow your surgeon's instructions on when to stop Aspirin.  If no instructions were given by your surgeon then you will need to call the office to get those instructions.    One week prior to surgery, STOP taking any Aleve, Naproxen, Ibuprofen, Motrin, Advil, Goody's, BC's, all herbal medications, fish oil, and non-prescription vitamins.                     Do NOT Smoke (Tobacco/Vaping) for 24 hours prior to your procedure.  If you use a CPAP at night, you may bring your mask/headgear for your overnight stay.   You will be asked to remove any contacts, glasses, piercing's, hearing aid's, dentures/partials prior to surgery. Please bring cases for these items if needed.    Patients discharged the day of surgery will not be allowed to drive home, and someone needs to stay with them for 24 hours.  SURGICAL WAITING ROOM VISITATION Patients may have no more than 2 support people in the waiting area - these visitors may rotate.   Pre-op nurse will coordinate an  appropriate time for 1 ADULT support person, who may not rotate, to accompany patient in pre-op.  Children under the age of 10 must have an adult with them who is not the patient and must remain in the main waiting area with an adult.  If the patient needs to stay at the hospital during part of their recovery, the visitor guidelines for inpatient rooms apply.  Please refer to the [facility] website for the visitor guidelines for any additional information.   If you received a COVID test during your pre-op visit  it is requested that you wear a mask when out in public, stay away from anyone that may not be feeling well and notify your surgeon if you develop symptoms. If you have been in contact with anyone that has tested positive in the last 10 days please notify you surgeon.      Pre-operative CHG Bathing Instructions   You can play a key role in reducing the risk of infection after surgery. Your skin needs to be as free of germs as possible. You can reduce the number of germs on your skin by washing with CHG (chlorhexidine gluconate) soap before surgery. CHG is an antiseptic soap that kills germs and continues to kill germs even after washing.   DO NOT use if you have an allergy to chlorhexidine/CHG or antibacterial soaps. If your skin becomes reddened or irritated, stop using the CHG and notify one of  our RNs at [phone].              TAKE A SHOWER THE NIGHT BEFORE SURGERY AND THE DAY OF SURGERY    Please keep in mind the following:  DO NOT shave, including legs and underarms, 48 hours prior to surgery.   You may shave your face before/day of surgery.  Place clean sheets on your bed the night before surgery Use a clean washcloth (not used since being washed) for each shower. DO NOT sleep with pet's night before surgery.  CHG Shower Instructions:  Wash your face and private area with normal soap. If you choose to wash your hair, wash first with your normal shampoo.  After you use  shampoo/soap, rinse your hair and body thoroughly to remove shampoo/soap residue.  Turn the water OFF and apply half the bottle of CHG soap to a CLEAN washcloth.  Apply CHG soap ONLY FROM YOUR NECK DOWN TO YOUR TOES (washing for 3-5 minutes)  DO NOT use CHG soap on face, private areas, open wounds, or sores.  Pay special attention to the area where your surgery is being performed.  If you are having back surgery, having someone wash your back for you may be helpful. Wait 2 minutes after CHG soap is applied, then you may rinse off the CHG soap.  Pat dry with a clean towel  Put on clean pajamas    Additional instructions for the day of surgery: DO NOT APPLY any lotions, deodorants, cologne, or perfumes.   Do not wear jewelry or makeup Do not wear nail polish, gel polish, artificial nails, or any other type of covering on natural nails (fingers and toes) Do not bring valuables to the hospital. [facility] is not responsible for valuables/personal belongings. Put on clean/comfortable clothes.  Please brush your teeth.  Ask your nurse before applying any prescription medications to the skin.

## 2023-11-03 NOTE — Progress Notes (Signed)
 Location of Breast Cancer: Left Breast Cancer Stage 1- Estrogen Receptor Positive  Histology per Pathology Report:    Receptor Status: ER(Positive), PR (Positive), Her2-neu (Negative), Ki-67(1%)  Did patient present with symptoms (if so, please note symptoms) or was this found on screening mammography?: Mammogram   10/23/2023 Breast Ultrasound    Past/Anticipated interventions by surgeon, if any:  10/23/2023 Dr. Luisa Hart  Patient opted for Left Breast Seed Lumpectomy   Past/Anticipated interventions by medical oncology, if any:  10/23/2023 Dr. Luisa Hart  Radiation   Lymphedema issues, if any:  None   Pain issues, if any:   None  SAFETY ISSUES: Prior radiation? None Pacemaker/ICD? None Possible current pregnancy?N/A Is the patient on methotrexate? None  Current Complaints / other details:   None  BP (!) 157/88 (BP Location: Left Arm, Patient Position: Sitting, Cuff Size: Normal)   Pulse 86   Temp (!) 97 F (36.1 C)   Resp 18   Ht 5\' 4"  (1.626 m)   Wt 145 lb (65.8 kg)   SpO2 99%   BMI 24.89 kg/m   Wt Readings from Last 3 Encounters:  11/10/23 145 lb (65.8 kg)  11/09/23 145 lb 9.6 oz (66 kg)  11/06/23 146 lb 14.4 oz (66.6 kg)

## 2023-11-03 NOTE — Progress Notes (Signed)
 PCP - Dr Joycelyn Rua Cardiologist - none Oncology - Dr Truett Perna  Chest x-ray - n/a EKG - 11/06/23 Stress Test - n/a ECHO - n/a Cardiac Cath - n/a  ICD Pacemaker/Loop - n/a  Sleep Study -  n/a  Diabetes - n/a  Blood Thinner Instructions:  n/a  Aspirin Instructions: Last dose was on 11/02/23.  ERAS - clears til 8:30 AM DOS  Anesthesia review: Yes, EKG  STOP now taking any Aspirin (unless otherwise instructed by your surgeon), Aleve, Naproxen, Ibuprofen, Motrin, Advil, Goody's, BC's, all herbal medications, fish oil, and all vitamins.   Coronavirus Screening Do you have any of the following symptoms:  Cough yes/no: No Fever (>100.21F)  yes/no: No Runny nose yes/no: No Sore throat yes/no: No Difficulty breathing/shortness of breath  yes/no: No  Have you traveled in the last 14 days and where? yes/no: No  Patient verbalized understanding of instructions that were given to them at the PAT appointment. Patient was also instructed that they will need to review over the PAT instructions again at home before surgery.

## 2023-11-06 ENCOUNTER — Encounter (HOSPITAL_COMMUNITY)
Admission: RE | Admit: 2023-11-06 | Discharge: 2023-11-06 | Disposition: A | Discharge status: Home / Self Care | Source: Ambulatory Visit | Attending: Surgery | Admitting: Surgery

## 2023-11-06 ENCOUNTER — Other Ambulatory Visit: Payer: Self-pay

## 2023-11-06 ENCOUNTER — Encounter (HOSPITAL_COMMUNITY): Payer: Self-pay

## 2023-11-06 VITALS — BP 144/91 | HR 99 | Temp 98.0°F | Resp 18 | Ht 64.0 in | Wt 146.9 lb

## 2023-11-06 DIAGNOSIS — Z01810 Encounter for preprocedural cardiovascular examination: Secondary | ICD-10-CM | POA: Diagnosis present

## 2023-11-06 DIAGNOSIS — Z01818 Encounter for other preprocedural examination: Secondary | ICD-10-CM | POA: Insufficient documentation

## 2023-11-06 DIAGNOSIS — R9431 Abnormal electrocardiogram [ECG] [EKG]: Secondary | ICD-10-CM | POA: Insufficient documentation

## 2023-11-06 DIAGNOSIS — M810 Age-related osteoporosis without current pathological fracture: Secondary | ICD-10-CM | POA: Diagnosis not present

## 2023-11-06 DIAGNOSIS — Z79899 Other long term (current) drug therapy: Secondary | ICD-10-CM | POA: Insufficient documentation

## 2023-11-06 DIAGNOSIS — Z9011 Acquired absence of right breast and nipple: Secondary | ICD-10-CM | POA: Insufficient documentation

## 2023-11-06 DIAGNOSIS — C50912 Malignant neoplasm of unspecified site of left female breast: Secondary | ICD-10-CM | POA: Diagnosis not present

## 2023-11-06 DIAGNOSIS — Z853 Personal history of malignant neoplasm of breast: Secondary | ICD-10-CM | POA: Diagnosis not present

## 2023-11-06 DIAGNOSIS — Z01812 Encounter for preprocedural laboratory examination: Secondary | ICD-10-CM | POA: Diagnosis present

## 2023-11-06 LAB — CBC
HCT: 41.9 % (ref 36.0–46.0)
Hemoglobin: 14.3 g/dL (ref 12.0–15.0)
MCH: 29 pg (ref 26.0–34.0)
MCHC: 34.1 g/dL (ref 30.0–36.0)
MCV: 85 fL (ref 80.0–100.0)
Platelets: 266 10*3/uL (ref 150–400)
RBC: 4.93 MIL/uL (ref 3.87–5.11)
RDW: 12.2 % (ref 11.5–15.5)
WBC: 8.7 10*3/uL (ref 4.0–10.5)
nRBC: 0 % (ref 0.0–0.2)

## 2023-11-06 LAB — BASIC METABOLIC PANEL WITH GFR
Anion gap: 8 (ref 5–15)
BUN: 20 mg/dL (ref 8–23)
CO2: 28 mmol/L (ref 22–32)
Calcium: 10.5 mg/dL — ABNORMAL HIGH (ref 8.9–10.3)
Chloride: 103 mmol/L (ref 98–111)
Creatinine, Ser: 0.93 mg/dL (ref 0.44–1.00)
GFR, Estimated: >60 mL/min (ref >60)
Glucose, Bld: 93 mg/dL (ref 70–99)
Potassium: 3.4 mmol/L — ABNORMAL LOW (ref 3.5–5.1)
Sodium: 139 mmol/L (ref 135–145)

## 2023-11-07 NOTE — Anesthesia Preprocedure Evaluation (Addendum)
 Anesthesia Evaluation  Patient identified by MRN, date of birth, ID band Patient awake    Reviewed: Allergy & Precautions, NPO status , Patient's Chart, lab work & pertinent test results  History of Anesthesia Complications Negative for: history of anesthetic complications  Airway Mallampati: II  TM Distance: >3 FB Neck ROM: Full    Dental no notable dental hx.    Pulmonary neg pulmonary ROS   Pulmonary exam normal        Cardiovascular hypertension, Pt. on medications Normal cardiovascular exam     Neuro/Psych negative neurological ROS     GI/Hepatic negative GI ROS, Neg liver ROS,,,  Endo/Other  negative endocrine ROS    Renal/GU negative Renal ROS     Musculoskeletal negative musculoskeletal ROS (+)    Abdominal   Peds  Hematology negative hematology ROS (+)   Anesthesia Other Findings Left breast ca  Reproductive/Obstetrics negative OB ROS                              Anesthesia Physical Anesthesia Plan  ASA: 2  Anesthesia Plan: General   Post-op Pain Management: Tylenol PO (pre-op)*   Induction: Intravenous  PONV Risk Score and Plan: 3 and Treatment may vary due to age or medical condition, Ondansetron and Dexamethasone  Airway Management Planned: LMA  Additional Equipment: None  Intra-op Plan:   Post-operative Plan: Extubation in OR  Informed Consent: I have reviewed the patients History and Physical, chart, labs and discussed the procedure including the risks, benefits and alternatives for the proposed anesthesia with the patient or authorized representative who has indicated his/her understanding and acceptance.     Dental advisory given  Plan Discussed with: CRNA  Anesthesia Plan Comments: (PAT note written 11/07/2023 by Shonna Chock, PA-C.  )        Anesthesia Quick Evaluation

## 2023-11-07 NOTE — Progress Notes (Signed)
 Anesthesia Chart Review:  Case: [id number] Date/Time: 11/14/23 1115   Procedure: BREAST LUMPECTOMY WITH RADIOACTIVE SEED LOCALIZATION (Left: Breast)   Anesthesia type: General   Diagnosis: Stage I breast cancer, left (HCC) [C50.912]   Pre-op diagnosis: LEFT BREAST CANCER   [facility]: [facility] ROOM 10 / [facility]   Surgeons: Harriette Bouillon, MD       DISCUSSION: Patient is a 78 year old female scheduled for the above procedure.  History includes never smoker, HTN, breast cancer (right mastectomy 1997; left breast IDC 10/13/23), hysterectomy/BSO (01/20/10), osteoporosis.  Amlodipine added in 05/2023 for HTN.Currently on losartan-hydrochlorothiazide 100-35 mg daily, amlodipine 2.5 mg daily. She says BP readings overall improved with addition of amlodipine. Home readings ~ high 120s/high 80's.  EKG showed NSR, LVH with repolarization abnormality (negative T waves in inferolateral leads). This in new when compared to her last EKGs in [place] from 2001 and 2011. IShe does have chronic HTN. I called and spoke with her. She denied chest pain, SOB, edema, syncope, palpitations, orthopnea. 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 She also walks at the park about 2x/week for around a mile and can go up a flight of stairs at home with no issues.  Last ASA reported as 11/07/23.   Currently last lab results are a CBC and BMP from 11/06/23 which show normal CBC, K 3.4, glucose 93, Cr 0.93, calcium elevated at 10.5.   RSL 11/13/23 at 2:00 PM.   Reviewed above with anesthesiologist Leslye Peer, MD. Patient denied CV symptoms and is active, > 4 METS. If not acute changes then it is anticipated that she can proceed as planned.    VS: BP (!) 144/91   Pulse 99   Temp 36.7 C (Oral)   Resp 18   Ht 5\' 4"  (1.626 m)   Wt 66.6 kg   SpO2 97%   BMI 25.22 kg/m  BP Readings from Last 3 Encounters:  11/06/23 (!) 144/91  04/13/23 (!) 150/100  07/25/18 (!) 152/80      PROVIDERS: Joycelyn Rua, MD is PCP  Ladene Artist, MD is HEM-ONC, scheduled new visit 11/09/23 Lonie Peak, MD is RAD-ONC   LABS: Preoperative labs noted from 11/06/23.  (all labs ordered are listed, but only abnormal results are displayed)  Labs Reviewed  BASIC METABOLIC PANEL WITH GFR - Abnormal; Notable for the following components:      Result Value   Potassium 3.4 (*)    Calcium 10.5 (*)    All other components within normal limits  CBC    EKG:  EKG 11/06/23: Normal sinus rhythm Left ventricular hypertrophy with repolarization abnormality ( R in aVL , Cornell product ) Abnormal ECG When compared with ECG of 19-Jan-2010 08:03, PREVIOUS ECG IS PRESENT Since last tracing LVH is more pronounced and anterolateral T wave abnormality is new Confirmed by Armanda Magic [phone]) on 11/06/2023 8:55:55 PM   CV: N/A   Past Medical History:  Diagnosis Date   Breast cancer (HCC) 1997   Right Breast   History of left breast cancer 10/2023   Hypertension    Osteoporosis     Past Surgical History:  Procedure Laterality Date   BREAST BIOPSY Left 10/13/2023   [place] LT BREAST BX W LOC DEV 1ST LESION IMG BX SPEC US GUIDE 10/13/2023 [facility]AMMO[facility]   BREAST CYST ASPIRATION Left 02/13/2017   COLONOSCOPY     EYE SURGERY Bilateral    bilateral cataracts &  right retina surgery   MASTECTOMY Right 1997   TUBAL LIGATION     VAGINAL HYSTERECTOMY  2011    MEDICATIONS:  alendronate (FOSAMAX) 70 MG tablet   amLODipine (NORVASC) 2.5 MG tablet   aspirin 81 MG chewable tablet   cholecalciferol (VITAMIN D3) 25 MCG (1000 UNIT) tablet   losartan-hydrochlorothiazide (HYZAAR) 100-25 MG tablet   Multiple Vitamin (MULTIVITAMIN) tablet   No current facility-administered medications for this encounter.    Shonna Chock, PA-C Surgical Short Stay/Anesthesiology [facility] Phone [phone] [facility] Phone [phone] 11/07/2023 2:47 PM

## 2023-11-09 ENCOUNTER — Encounter: Payer: Self-pay | Admitting: *Deleted

## 2023-11-09 ENCOUNTER — Telehealth: Payer: Self-pay | Admitting: *Deleted

## 2023-11-09 ENCOUNTER — Inpatient Hospital Stay: Attending: Oncology | Admitting: Oncology

## 2023-11-09 VITALS — BP 158/86 | HR 89 | Temp 98.2°F | Resp 18 | Ht 64.0 in | Wt 145.6 lb

## 2023-11-09 DIAGNOSIS — E876 Hypokalemia: Secondary | ICD-10-CM

## 2023-11-09 DIAGNOSIS — I10 Essential (primary) hypertension: Secondary | ICD-10-CM

## 2023-11-09 DIAGNOSIS — N6322 Unspecified lump in the left breast, upper inner quadrant: Secondary | ICD-10-CM

## 2023-11-09 DIAGNOSIS — Z803 Family history of malignant neoplasm of breast: Secondary | ICD-10-CM

## 2023-11-09 DIAGNOSIS — C50812 Malignant neoplasm of overlapping sites of left female breast: Secondary | ICD-10-CM | POA: Insufficient documentation

## 2023-11-09 DIAGNOSIS — Z170 Estrogen receptor positive status [ER+]: Secondary | ICD-10-CM | POA: Insufficient documentation

## 2023-11-09 NOTE — Progress Notes (Unsigned)
 PATIENT NAVIGATOR PROGRESS NOTE  Name: Katherine Stout Date: 11/09/2023 MRN: [id number]  DOB: 18-Jul-1946   Reason for visit:  {onc csw reason for visit:[id number]}  Comments:  ***    Time spent counseling/coordinating care: {csw time counsel:[id number]}

## 2023-11-09 NOTE — Progress Notes (Signed)
 Radiation Oncology         [phone] ________________________________  Initial Outpatient Consultation  Name: Katherine Stout MRN: [id number]  Date: 11/10/2023  DOB: [birth year]  NF:[id number], Katherine Senior, MD  Katherine Bouillon, MD   REFERRING PHYSICIAN: Harriette Bouillon, MD  DIAGNOSIS: No diagnosis found.   Cancer Staging  No matching staging information was found for the patient.   Stage *** Left Breast UOQ Invasive Ductal Carcinoma, ER+ / PR+ / Her2-, Grade 1  CHIEF COMPLAINT: Here to discuss management of left breast cancer  HISTORY OF PRESENT ILLNESS::Katherine Stout is a 78 y.o. female who presented with a left breast abnormality on the following imaging: bilateral screening mammogram on the date of 09/14/23. No symptoms, if any, were reported at that time. Left breast diagnostic mammogram and left breast ultrasound on 09/12/23 further revealed a suspicious mass in the 12 o'clock left breast located 3 cm, measuring 5 mm in the greatest extent. No evidence of left axillary lymphadenopathy was demonstrated.      Biopsy of the 12 o'clock left breast mass on date of 10/13/23 showed grade 1 invasive ductal carcinoma measuring 6 mm in the greatest linear extent of the sample; negative for LVI.  ER status: 100% positive with strong staining intensity; PR status 30% positive with moderate staining intensity; Proliferation marker Ki67 at 1%; Her2 status negative; Grade 1. No lymph nodes were examined.   She was accordingly seen in consultation by Dr. Luisa Stout on 03/17 and has opted to proceed with a left breast lumpectomy (currently scheduled for 11/14/23). SLN biopsies will be omitted in light of the patients age and her low grade histology.   She was also seen in consultation yesterday by Dr. Truett Stout to established oncologic care. She will follow up with Dr. Truett Stout after her surgery to discuss antiestrogen treatment options.   She does have a prior history of multifocal right  breast cancer s/p right mastectomy w/ nodal dissections in 1997 followed by chemotherapy. She did not receive radiation therapy to the right breast for this.   ***   PREVIOUS RADIATION THERAPY: No  PAST MEDICAL HISTORY:  has a past medical history of Breast cancer (HCC) (1997), History of left breast cancer (10/2023), Hypertension, and Osteoporosis.    PAST SURGICAL HISTORY: Past Surgical History:  Procedure Laterality Date   BREAST BIOPSY Left 10/13/2023   [place] LT BREAST BX W LOC DEV 1ST LESION IMG BX SPEC US GUIDE 10/13/2023 [facility] MAMMOGRAPHY   BREAST CYST ASPIRATION Left 02/13/2017   COLONOSCOPY     EYE SURGERY Bilateral    bilateral cataracts & right retina surgery   MASTECTOMY Right 1997   TUBAL LIGATION     VAGINAL HYSTERECTOMY  2011    FAMILY HISTORY: family history includes Breast cancer in her maternal aunt.  SOCIAL HISTORY:  reports that she has never smoked. She has never used smokeless tobacco. She reports current alcohol use. She reports that she does not use drugs.  ALLERGIES: Carisoprodol  MEDICATIONS:  Current Outpatient Medications  Medication Sig Dispense Refill   alendronate (FOSAMAX) 70 MG tablet Take 70 mg by mouth once a week. Takes on Thursday     amLODipine (NORVASC) 2.5 MG tablet Take 1 tablet by mouth daily.     aspirin 81 MG chewable tablet Chew 81 mg by mouth daily. (Patient not taking: Reported on 11/09/2023)     cholecalciferol (VITAMIN D3) 25 MCG (1000 UNIT) tablet Take 2,000 Units by mouth daily.     losartan-hydrochlorothiazide (HYZAAR)  100-25 MG tablet Take 1 tablet by mouth daily.     Multiple Vitamin (MULTIVITAMIN) tablet Take 1 tablet by mouth daily.     No current facility-administered medications for this encounter.    REVIEW OF SYSTEMS: As above in HPI.   PHYSICAL EXAM:  vitals were not taken for this visit.   General: Alert and oriented, in no acute distress HEENT: Head is normocephalic. Extraocular movements are intact.  Heart:  Regular in rate and rhythm with no murmurs, rubs, or gallops. Chest: Clear to auscultation bilaterally, with no rhonchi, wheezes, or rales. Abdomen: Soft, nontender, nondistended, with no rigidity or guarding. Extremities: No cyanosis or edema. Skin: No concerning lesions. Musculoskeletal: symmetric strength and muscle tone throughout. Neurologic: Cranial nerves II through XII are grossly intact. No obvious focalities. Speech is fluent. Coordination is intact. Psychiatric: Judgment and insight are intact. Affect is appropriate. Breasts: *** . No other palpable masses appreciated in the breasts or axillae *** .    ECOG = ***  0 - Asymptomatic (Fully active, able to carry on all predisease activities without restriction)  1 - Symptomatic but completely ambulatory (Restricted in physically strenuous activity but ambulatory and able to carry out work of a light or sedentary nature. For example, light housework, office work)  2 - Symptomatic, <50% in bed during the day (Ambulatory and capable of all self care but unable to carry out any work activities. Up and about more than 50% of waking hours)  3 - Symptomatic, >50% in bed, but not bedbound (Capable of only limited self-care, confined to bed or chair 50% or more of waking hours)  4 - Bedbound (Completely disabled. Cannot carry on any self-care. Totally confined to bed or chair)  5 - Death   Katherine Stout MM, Katherine Stout, Katherine Stout, et al. [phone]). "Toxicity and response criteria of the [facility]". Am. Katherine Stout. Oncol. 5 (6): 649-55   LABORATORY DATA:   CBC    Component Value Date/Time   WBC 8.7 11/06/2023 0930   RBC 4.93 11/06/2023 0930   HGB 14.3 11/06/2023 0930   HCT 41.9 11/06/2023 0930   PLT 266 11/06/2023 0930   MCV 85.0 11/06/2023 0930   MCH 29.0 11/06/2023 0930   MCHC 34.1 11/06/2023 0930   RDW 12.2 11/06/2023 0930    CMP     Component Value Date/Time   NA 139 11/06/2023 0930   K 3.4 (L) 11/06/2023  0930   CL 103 11/06/2023 0930   CO2 28 11/06/2023 0930   GLUCOSE 93 11/06/2023 0930   BUN 20 11/06/2023 0930   CREATININE 0.93 11/06/2023 0930   CALCIUM 10.5 (H) 11/06/2023 0930   GFRNONAA >60 11/06/2023 0930      RADIOGRAPHY: [place] LT BREAST BX W LOC DEV 1ST LESION IMG BX SPEC US GUIDE Addendum Date: 10/17/2023 ADDENDUM REPORT: 10/17/2023 11:32 ADDENDUM: PATHOLOGY revealed: 1. Breast, left, needle core biopsy, 12:00 3 cmfn : INVASIVE DUCTAL CARCINOMA. OVERALL GRADE: 1. LYMPHOVASCULAR INVASION: NOT IDENTIFIED. CANCER LENGTH: 0.6 CM. CALCIFICATIONS: NOT IDENTIFIED. Pathology results are CONCORDANT with imaging findings, per Dr. Laveda Abbe. Pathology results and recommendations were discussed with patient via telephone on 10/16/2023 by Randa Lynn RN. Patient reported biopsy site doing well with no adverse symptoms, and only slight tenderness at the site. Post biopsy care instructions were reviewed, questions were answered and my direct phone number was provided. Patient was instructed to call [facility] for any additional questions or concerns related to biopsy site.  RECOMMENDATIONS: 1. Surgical and oncological consultation. Surgical consultation has been arranged for patient to see Dr. Harriette Stout at [facility] on 10/23/2023. Per patient request, Randa Lynn RN sent Epic message to oncologist, Dr. Thornton Papas, requesting his oncology service for this patient. Dr. Truett Stout responded he would meet with patient most likely after her surgery. Pathology results reported by Randa Lynn RN on 10/17/2023. Electronically Signed   By: Harmon Pier M.D.   On: 10/17/2023 11:32   Result Date: 10/17/2023 CLINICAL DATA:  78 year old female presents for tissue sampling of 0.5 cm UPPER LEFT breast mass. EXAM: ULTRASOUND GUIDED LEFT BREAST CORE NEEDLE BIOPSY COMPARISON:  Previous exam(s). PROCEDURE: I met with the patient and we discussed the procedure of ultrasound-guided biopsy,  including benefits and alternatives. We discussed the high likelihood of a successful procedure. We discussed the risks of the procedure, including infection, bleeding, tissue injury, clip migration, and inadequate sampling. Informed written consent was given. The usual time-out protocol was performed immediately prior to the procedure. Using sterile technique and 1% Lidocaine as local anesthetic, under direct ultrasound visualization, a 12 gauge spring-loaded device was used to perform biopsy of the 0.5 cm mass at the 12 o'clock position of the LEFT breast 3 cm from the nipple using a superomedial approach. At the conclusion of the procedure a RIBBON shaped tissue marker clip was deployed into the biopsy cavity. Follow up 2 view mammogram was performed and dictated separately. IMPRESSION: Ultrasound guided biopsy of 0.5 cm UPPER LEFT breast mass. No apparent complications. Electronically Signed: By: Harmon Pier M.D. On: 10/13/2023 13:22   MM CLIP PLACEMENT LEFT Result Date: 10/13/2023 CLINICAL DATA:  Evaluate RIBBON biopsy clip position following ultrasound-guided LEFT breast biopsy. EXAM: 3D DIAGNOSTIC LEFT MAMMOGRAM POST ULTRASOUND BIOPSY COMPARISON:  Previous exam(s). ACR Breast Density Category b: There are scattered areas of fibroglandular density. FINDINGS: 3D Mammographic images were obtained following ultrasound guided biopsy of the 0.5 cm mass at the 12 o'clock position of the LEFT breast. The biopsy marking clip is in expected position at the site of biopsy. IMPRESSION: Appropriate positioning of the RIBBON shaped biopsy marking clip at the site of biopsy in the UPPER LEFT breast. Final Assessment: Post Procedure Mammograms for Marker Placement Electronically Signed   By: Harmon Pier M.D.   On: 10/13/2023 13:34      IMPRESSION/PLAN: ***   It was a pleasure meeting the patient today. We discussed the risks, benefits, and side effects of radiotherapy. I recommend radiotherapy to the *** to reduce her  risk of locoregional recurrence by 2/3.  We discussed that radiation would take approximately *** weeks to complete and that I would give the patient a few weeks to heal following surgery before starting treatment planning. *** If chemotherapy were to be given, this would precede radiotherapy. We spoke about acute effects including skin irritation and fatigue as well as much less common late effects including internal organ injury or irritation. We spoke about the latest technology that is used to minimize the risk of late effects for patients undergoing radiotherapy to the breast or chest wall. No guarantees of treatment were given. The patient is enthusiastic about proceeding with treatment. I look forward to participating in the patient's care.  I will await her referral back to me for postoperative follow-up and eventual CT simulation/treatment planning.  On date of service, in total, I spent *** minutes on this encounter. Patient was seen in person.   __________________________________________   Lonie Peak, MD  This document serves as a record of services personally performed by Lonie Peak, MD. It was created on her behalf by Neena Rhymes, a trained medical scribe. The creation of this record is based on the scribe's personal observations and the provider's statements to them. This document has been checked and approved by the attending provider.

## 2023-11-09 NOTE — Telephone Encounter (Signed)
 L/M regarding potassium level 3.4 and to discuss increasing potassium intake and to make surgeon team aware Encouraged her to call with any quesitons

## 2023-11-09 NOTE — Progress Notes (Signed)
 PATIENT NAVIGATOR PROGRESS NOTE  Name: Katherine Stout Date: 11/09/2023 MRN: [id number]  DOB: January 27, 1946   Reason for visit:  New patient appt  Comments:  Met with Ms Lininger during visit with Dr Truett Perna  She is having surgery on 4/8 with Dr Luisa Hart Given contact number to call with any questions or issues She will be scheduled with Dr Truett Perna on 4/24 two weeks after surgery to review pathology and adjuvant treatment needs No other needs identified at this time    Time spent counseling/coordinating care: > 60 minutes

## 2023-11-09 NOTE — Progress Notes (Signed)
 [facility] New Patient Consult   Requesting MD: Joycelyn Rua, Md [address],  [place] [address]   Twana Wileman Mcreynolds 78 y.o.  [birth year]    Reason for Consult: Breast cancer   HPI: Katherine Stout underwent a screening left mammogram 09/14/2023.  A possible mass was noted in the left breast.  Diagnostic mammogram 10/10/2023 revealed a persistent asymmetry with subtle architectural distortion.  A targeted ultrasound of the left upper outer breast revealed a hypoechoic irregular mass at 12:00 3 cm from the nipple measuring 4 x 5 x 4 mm.  No suspicious axillary lymph nodes. An ultrasound-guided biopsy on 10/13/2023 revealed invasive ductal carcinoma.  She was referred to Dr. Luisa Hart and is scheduled for a seed localized lumpectomy on 11/14/2023.  Dr. Luisa Hart did not recommend a sentinel lymph node biopsy.  Ms. Gebert reports feeling completely well prior to the routine mammogram in February.  No change has been noted at the left breast.  Past Medical History:  Diagnosis Date   Breast cancer [facility]), mastectomy, adjuvant CMF and 5 years of tamoxifen completed in September 2002 1997   Right Breast   History of left breast cancer 10/2023   Hypertension    Osteoporosis     .  G3 P2, 1 miscarriage, first child at age 33, menopause at age 53 following chemotherapy  Past Surgical History:  Procedure Laterality Date   BREAST BIOPSY Left 10/13/2023   [place] LT BREAST BX W LOC DEV 1ST LESION IMG BX SPEC US GUIDE 10/13/2023 [facility] MAMMOGRAPHY   BREAST CYST ASPIRATION Left 02/13/2017   COLONOSCOPY     EYE SURGERY Bilateral    bilateral cataracts & right retina surgery   MASTECTOMY Right 1997   TUBAL LIGATION     VAGINAL HYSTERECTOMY  2011    .  Bilateral cataract surgery  Medications: Reviewed  Allergies:  Allergies  Allergen Reactions   Carisoprodol Other (See Comments)    Family history: A maternal aunt had breast cancer.  Her mother died of leukemia at  age 58.  Social History:   She lives with her husband in [place].  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  ROS:   Positives include: Dry eyes  A complete ROS was otherwise negative.  Physical Exam:  Blood pressure (!) 158/86, pulse 89, temperature 98.2 F (36.8 C), temperature source Temporal, resp. rate 18, height 5\' 4"  (1.626 m), weight 145 lb 9.6 oz (66 kg), SpO2 99%.  HEENT: Oropharynx without visible mass, neck without mass Lungs: Clear bilaterally Cardiac: Rate and rhythm Abdomen: No hepatosplenomegaly, nontender, no mass  Vascular: No leg edema Lymph nodes: No cervical, supraclavicular, axillary, or inguinal nodes Neurologic: Alert and oriented, the motor exam appears intact in the upper and lower extremities bilaterally Skin: Multiple benign-appearing moles over the trunk Musculoskeletal: No spine tenderness Breasts: Right mastectomy with an implant in place.  No evidence for chest wall tumor recurrence.  Left breast without mass   LAB:  CBC  Lab Results  Component Value Date   WBC 8.7 11/06/2023   HGB 14.3 11/06/2023   HCT 41.9 11/06/2023   MCV 85.0 11/06/2023   PLT 266 11/06/2023        CMP  Lab Results  Component Value Date   NA 139 11/06/2023   K 3.4 (L) 11/06/2023   CL 103 11/06/2023   CO2 28 11/06/2023   GLUCOSE 93 11/06/2023   BUN 20 11/06/2023   CREATININE 0.93 11/06/2023  CALCIUM 10.5 (H) 11/06/2023   GFRNONAA >60 11/06/2023   GFRAA  01/19/2010    >60        The eGFR has been calculated using the MDRD equation. This calculation has not been validated in all clinical situations. eGFR's persistently <60 mL/min signify possible Chronic Kidney Disease.      Imaging:  No results found.    Assessment/Plan:   Left breast cancer 09/14/2023 screening mammogram-possible left breast mass 10/10/2023 diagnostic left mammogram/ultrasound: Persistent focal asymmetry with subtle architectural distortion, ultrasound revealed a mass  at 12:00 3 cm from the nipple measuring 4 x 5 x 4 mm Ultrasound-guided biopsy and clip placement 10/13/2023: Invasive ductal carcinoma, grade 1, no lymphovascular invasion, 0.6 cm, ER 100%, PR 30%, Ki-67 1%, HER2 negative (1+) Right breast cancer 1997-status post a right mastectomy, adjuvant CMF, and 5 years of tamoxifen Family history of breast cancer-maternal aunt Hypertension Hysterectomy G3 P2, 1 miscarriage, first child at age 40, menopause at age 42 following chemotherapy Bilateral cataract surgery   Disposition:   Ms. Buske has a remote history of right-sided breast cancer.  She was diagnosed in 1997 and completed treatment with a right mastectomy, adjuvant CMF, and 5 years of tamoxifen.  She now has an apparent early-stage left-sided breast cancer.  The tumor is hormone receptor positive.  She is scheduled for a seed localized lumpectomy next week.  She has been referred to radiation oncology.  Ms. Willhelm may be a candidate for to forego radiation given her age, the small tumor size, and high level hormone receptor expression.  We discussed the possibility of adjuvant radiation and hormonal therapy.  She will return for an office visit after the lumpectomy procedure.  We will review the surgical pathology report and make adjuvant treatment recommendations.  The mildly decreased potassium and elevated calcium levels on a recent chemistry panel may be related to HCTZ therapy.  We will asked Dr. Luisa Hart to repeat a chemistry panel on preoperative labs.  Thornton Papas, MD  11/09/2023, 4:19 PM

## 2023-11-10 ENCOUNTER — Ambulatory Visit
Admission: RE | Admit: 2023-11-10 | Discharge: 2023-11-10 | Disposition: A | Discharge status: Home / Self Care | Source: Ambulatory Visit | Attending: Radiation Oncology | Admitting: Radiation Oncology

## 2023-11-10 ENCOUNTER — Other Ambulatory Visit: Payer: Self-pay

## 2023-11-10 VITALS — BP 157/88 | HR 86 | Temp 97.0°F | Resp 18 | Ht 64.0 in | Wt 145.0 lb

## 2023-11-10 DIAGNOSIS — C50412 Malignant neoplasm of upper-outer quadrant of left female breast: Secondary | ICD-10-CM

## 2023-11-13 ENCOUNTER — Ambulatory Visit
Admission: RE | Admit: 2023-11-13 | Discharge: 2023-11-13 | Disposition: A | Discharge status: Home / Self Care | Source: Ambulatory Visit | Attending: Surgery | Admitting: Surgery

## 2023-11-13 DIAGNOSIS — C50912 Malignant neoplasm of unspecified site of left female breast: Secondary | ICD-10-CM

## 2023-11-13 HISTORY — PX: BREAST BIOPSY: SHX20

## 2023-11-14 ENCOUNTER — Ambulatory Visit (HOSPITAL_COMMUNITY): Payer: Self-pay | Admitting: Vascular Surgery

## 2023-11-14 ENCOUNTER — Ambulatory Visit
Admission: RE | Admit: 2023-11-14 | Discharge: 2023-11-14 | Disposition: A | Discharge status: Home / Self Care | Source: Ambulatory Visit | Attending: Surgery | Admitting: Surgery

## 2023-11-14 ENCOUNTER — Other Ambulatory Visit: Payer: Self-pay

## 2023-11-14 ENCOUNTER — Encounter (HOSPITAL_COMMUNITY): Payer: Self-pay | Admitting: Surgery

## 2023-11-14 ENCOUNTER — Encounter (HOSPITAL_COMMUNITY)
Admission: RE | Disposition: A | Discharge status: Home / Self Care | Payer: Self-pay | Source: Home / Self Care | Attending: Surgery

## 2023-11-14 ENCOUNTER — Encounter

## 2023-11-14 ENCOUNTER — Ambulatory Visit (HOSPITAL_COMMUNITY)
Admission: RE | Admit: 2023-11-14 | Discharge: 2023-11-14 | Disposition: A | Discharge status: Home / Self Care | Attending: Surgery | Admitting: Surgery

## 2023-11-14 DIAGNOSIS — Z1732 Human epidermal growth factor receptor 2 negative status: Secondary | ICD-10-CM | POA: Insufficient documentation

## 2023-11-14 DIAGNOSIS — Z9011 Acquired absence of right breast and nipple: Secondary | ICD-10-CM | POA: Diagnosis not present

## 2023-11-14 DIAGNOSIS — C50412 Malignant neoplasm of upper-outer quadrant of left female breast: Secondary | ICD-10-CM | POA: Diagnosis not present

## 2023-11-14 DIAGNOSIS — Z8249 Family history of ischemic heart disease and other diseases of the circulatory system: Secondary | ICD-10-CM | POA: Diagnosis not present

## 2023-11-14 DIAGNOSIS — C50912 Malignant neoplasm of unspecified site of left female breast: Secondary | ICD-10-CM | POA: Diagnosis present

## 2023-11-14 DIAGNOSIS — Z853 Personal history of malignant neoplasm of breast: Secondary | ICD-10-CM | POA: Diagnosis not present

## 2023-11-14 DIAGNOSIS — Z1721 Progesterone receptor positive status: Secondary | ICD-10-CM | POA: Insufficient documentation

## 2023-11-14 DIAGNOSIS — I10 Essential (primary) hypertension: Secondary | ICD-10-CM | POA: Diagnosis not present

## 2023-11-14 DIAGNOSIS — C50812 Malignant neoplasm of overlapping sites of left female breast: Secondary | ICD-10-CM | POA: Diagnosis not present

## 2023-11-14 DIAGNOSIS — Z170 Estrogen receptor positive status [ER+]: Secondary | ICD-10-CM | POA: Diagnosis not present

## 2023-11-14 DIAGNOSIS — Z79899 Other long term (current) drug therapy: Secondary | ICD-10-CM | POA: Insufficient documentation

## 2023-11-14 HISTORY — PX: BREAST LUMPECTOMY WITH RADIOACTIVE SEED LOCALIZATION: SHX6424

## 2023-11-14 SURGERY — BREAST LUMPECTOMY WITH RADIOACTIVE SEED LOCALIZATION
Anesthesia: General | Site: Breast | Laterality: Left

## 2023-11-14 MED ORDER — DEXAMETHASONE SODIUM PHOSPHATE 10 MG/ML IJ SOLN
INTRAMUSCULAR | Status: DC | PRN
  Administered 2023-11-14: 10 mg via INTRAVENOUS

## 2023-11-14 MED ORDER — ACETAMINOPHEN 500 MG PO TABS
1000.0000 mg | ORAL_TABLET | Freq: Once | ORAL | Status: AC
  Administered 2023-11-14: 1000 mg via ORAL
  Filled 2023-11-14: qty 2

## 2023-11-14 MED ORDER — CHLORHEXIDINE GLUCONATE 0.12 % MT SOLN
15.0000 mL | Freq: Once | OROMUCOSAL | Status: AC
  Administered 2023-11-14: 15 mL via OROMUCOSAL
  Filled 2023-11-14: qty 15

## 2023-11-14 MED ORDER — CHLORHEXIDINE GLUCONATE CLOTH 2 % EX PADS: 6.0000 | MEDICATED_PAD | Freq: Once | CUTANEOUS | Status: DC

## 2023-11-14 MED ORDER — ONDANSETRON HCL 4 MG/2ML IJ SOLN
INTRAMUSCULAR | Status: DC | PRN
  Administered 2023-11-14: 4 mg via INTRAVENOUS

## 2023-11-14 MED ORDER — FENTANYL CITRATE (PF) 250 MCG/5ML IJ SOLN
INTRAMUSCULAR | Status: DC | PRN
  Administered 2023-11-14: 25 ug via INTRAVENOUS
  Administered 2023-11-14: 50 ug via INTRAVENOUS

## 2023-11-14 MED ORDER — FENTANYL CITRATE (PF) 100 MCG/2ML IJ SOLN: 25.0000 ug | INTRAMUSCULAR | Status: DC | PRN

## 2023-11-14 MED ORDER — PROPOFOL 10 MG/ML IV BOLUS
INTRAVENOUS | Status: AC
  Filled 2023-11-14: qty 20

## 2023-11-14 MED ORDER — FENTANYL CITRATE (PF) 250 MCG/5ML IJ SOLN
INTRAMUSCULAR | Status: AC
  Filled 2023-11-14: qty 5

## 2023-11-14 MED ORDER — OXYCODONE HCL 5 MG/5ML PO SOLN: 5.0000 mg | Freq: Once | ORAL | Status: DC | PRN

## 2023-11-14 MED ORDER — OXYCODONE HCL 5 MG PO TABS: 5.0000 mg | ORAL_TABLET | Freq: Once | ORAL | Status: DC | PRN

## 2023-11-14 MED ORDER — OXYCODONE HCL 5 MG PO TABS: 5.0000 mg | ORAL_TABLET | Freq: Four times a day (QID) | ORAL | 0 refills | Status: DC | PRN

## 2023-11-14 MED ORDER — CEFAZOLIN SODIUM-DEXTROSE 2-4 GM/100ML-% IV SOLN
2.0000 g | INTRAVENOUS | Status: AC
  Administered 2023-11-14: 2 g via INTRAVENOUS
  Filled 2023-11-14: qty 100

## 2023-11-14 MED ORDER — LACTATED RINGERS IV SOLN: INTRAVENOUS | Status: DC

## 2023-11-14 MED ORDER — ORAL CARE MOUTH RINSE: 15.0000 mL | Freq: Once | OROMUCOSAL | Status: AC

## 2023-11-14 MED ORDER — PROPOFOL 10 MG/ML IV BOLUS
INTRAVENOUS | Status: DC | PRN
  Administered 2023-11-14: 140 mg via INTRAVENOUS

## 2023-11-14 MED ORDER — PHENYLEPHRINE HCL-NACL 20-0.9 MG/250ML-% IV SOLN
INTRAVENOUS | Status: DC | PRN
  Administered 2023-11-14: 40 ug/min via INTRAVENOUS

## 2023-11-14 MED ORDER — BUPIVACAINE-EPINEPHRINE (PF) 0.25% -1:200000 IJ SOLN
INTRAMUSCULAR | Status: AC
  Filled 2023-11-14: qty 30

## 2023-11-14 MED ORDER — BUPIVACAINE-EPINEPHRINE 0.25% -1:200000 IJ SOLN
INTRAMUSCULAR | Status: DC | PRN
  Administered 2023-11-14: 19 mL

## 2023-11-14 MED ORDER — LIDOCAINE 2% (20 MG/ML) 5 ML SYRINGE
INTRAMUSCULAR | Status: DC | PRN
  Administered 2023-11-14: 60 mg via INTRAVENOUS

## 2023-11-14 SURGICAL SUPPLY — 33 items
APPLIER CLIP 9.375 MED OPEN (MISCELLANEOUS) IMPLANT
BAG COUNTER SPONGE SURGICOUNT (BAG) ×2 IMPLANT
BINDER BREAST LRG (GAUZE/BANDAGES/DRESSINGS) IMPLANT
BINDER BREAST XLRG (GAUZE/BANDAGES/DRESSINGS) IMPLANT
CANISTER SUCT 3000ML PPV (MISCELLANEOUS) IMPLANT
CHLORAPREP W/TINT 26 (MISCELLANEOUS) ×2 IMPLANT
CLIP APPLIE 9.375 MED OPEN (MISCELLANEOUS) IMPLANT
COVER PROBE W GEL 5X96 (DRAPES) ×2 IMPLANT
COVER SURGICAL LIGHT HANDLE (MISCELLANEOUS) ×2 IMPLANT
DERMABOND ADVANCED .7 DNX12 (GAUZE/BANDAGES/DRESSINGS) ×2 IMPLANT
DEVICE DUBIN SPECIMEN MAMMOGRA (MISCELLANEOUS) ×2 IMPLANT
DRAPE CHEST BREAST 15X10 FENES (DRAPES) ×2 IMPLANT
ELECT CAUTERY BLADE 6.4 (BLADE) ×2 IMPLANT
ELECT REM PT RETURN 9FT ADLT (ELECTROSURGICAL) ×1 IMPLANT
ELECTRODE REM PT RTRN 9FT ADLT (ELECTROSURGICAL) ×2 IMPLANT
GAUZE PAD ABD 8X10 STRL (GAUZE/BANDAGES/DRESSINGS) ×2 IMPLANT
GLOVE BIO SURGEON STRL SZ8 (GLOVE) ×2 IMPLANT
GLOVE BIOGEL PI IND STRL 8 (GLOVE) ×2 IMPLANT
GOWN STRL REUS W/ TWL LRG LVL3 (GOWN DISPOSABLE) ×2 IMPLANT
GOWN STRL REUS W/ TWL XL LVL3 (GOWN DISPOSABLE) ×2 IMPLANT
KIT BASIN OR (CUSTOM PROCEDURE TRAY) ×2 IMPLANT
KIT MARKER MARGIN INK (KITS) ×2 IMPLANT
LIGHT WAVEGUIDE WIDE FLAT (MISCELLANEOUS) IMPLANT
NDL HYPO 25GX1X1/2 BEV (NEEDLE) ×2 IMPLANT
NEEDLE HYPO 25GX1X1/2 BEV (NEEDLE) ×1 IMPLANT
NS IRRIG 1000ML POUR BTL (IV SOLUTION) IMPLANT
PACK GENERAL/GYN (CUSTOM PROCEDURE TRAY) ×2 IMPLANT
SUT MNCRL AB 4-0 PS2 18 (SUTURE) ×2 IMPLANT
SUT SILK 2 0 SH (SUTURE) IMPLANT
SUT VIC AB 2-0 SH 27XBRD (SUTURE) IMPLANT
SUT VIC AB 3-0 SH 8-18 (SUTURE) ×2 IMPLANT
SYR CONTROL 10ML LL (SYRINGE) ×2 IMPLANT
TOWEL GREEN STERILE FF (TOWEL DISPOSABLE) IMPLANT

## 2023-11-14 NOTE — H&P (Signed)
 History of Present Illness: Katherine Stout is a 78 y.o. female who is seen today as an office consultation for evaluation of Breast Cancer  Patient presents for evaluation of stage I left breast cancer ER positive. Patient noted to have a 5 mm cluster of calcifications left breast upper outer quadrant. Core biopsy showed grade 1 invasive ductal carcinoma ER positive PR positive HER2/neu negative with a KI at 1%. Previous history of right mastectomy in 1997 for multifocal right breast cancer followed by chemotherapy. She did have a complete node dissection back then.   Review of Systems: A complete review of systems was obtained from the patient. I have reviewed this information and discussed as appropriate with the patient. See HPI as well for other ROS.    Medical History: Past Medical History:  Diagnosis Date  History of cancer  Hypertension   There is no problem list on file for this patient.  Past Surgical History:  Procedure Laterality Date  HYSTERECTOMY  MASTECTOMY    No Known Allergies  Current Outpatient Medications on File Prior to Visit  Medication Sig Dispense Refill  alendronate (FOSAMAX) 70 MG tablet take one tablet by mouth every week  amLODIPine (NORVASC) 2.5 MG tablet Take 2.5 mg by mouth once daily  aspirin 81 MG EC tablet Take 81 mg by mouth once daily  losartan-hydroCHLOROthiazide (HYZAAR) 100-25 mg tablet Take 1 tablet by mouth once daily  multivit-min/ferrous fumarate (MULTI VITAMIN ORAL) Take by mouth   No current facility-administered medications on file prior to visit.   Family History  Problem Relation Age of Onset  Leukemia Mother  Myocardial Infarction (Heart attack) Father  Brain cancer Sister    Social History   Tobacco Use  Smoking Status Never  Smokeless Tobacco Never    Social History   Socioeconomic History  Marital status: Married  Tobacco Use  Smoking status: Never  Smokeless tobacco: Never   Social Drivers of Health    Housing Stability: Unknown (10/23/2023)  Housing Stability Vital Sign  Homeless in the Last Year: No   Objective:   Vitals:  10/23/23 0914  BP: (!) 152/90  Pulse: 103  Weight: 66.7 kg (147 lb)  Height: 162.6 cm (5\' 4" )  PainSc: 0-No pain   Body mass index is 25.23 kg/m.  Physical Exam Vitals reviewed. Exam conducted with a chaperone present.  HENT:  Head: Normocephalic.  Cardiovascular:  Rate and Rhythm: Normal rate.  Pulmonary:  Effort: Pulmonary effort is normal.  Chest:  Breasts: Left: Normal. No mass.   Comments: Right breast absent. Reconstruction in place. No masses. Right axilla normal. No significant right arm lymphedema  Mild bruising left breast upper outer quadrant. Musculoskeletal:  Cervical back: Normal range of motion.  Lymphadenopathy:  Upper Body:  Right upper body: No supraclavicular or axillary adenopathy.  Left upper body: No supraclavicular or axillary adenopathy.  Skin: General: Skin is warm.  Neurological:  General: No focal deficit present.  Mental Status: She is alert.  Psychiatric:  Mood and Affect: Mood normal.     Labs, Imaging and Diagnostic Testing:  FINAL DIAGNOSIS  1. Breast, left, needle core biopsy, 12:00 3cmfn : INVASIVE DUCTAL CARCINOMA, SEE NOTE TUBULE FORMATION: SCORE 2 NUCLEAR PLEOMORPHISM: SCORE 2 MITOTIC COUNT: SCORE 1 TOTAL SCORE: 5 OVERALL GRADE: 1 LYMPHOVASCULAR INVASION: NOT IDENTIFIED CANCER LENGTH: 0.6 CM CALCIFICATIONS: NOT IDENTIFIED OTHER FINDINGS: NONE SEE NOTE  Diagnosis Note : Dr. Kenyon Ana reviewed the case and concurs with the interpretation. A breast prognostic profile (ER, PR, Ki-67  and HER2) is pending and will be reported in an addendum. [facility] was notified on 10/16/2023.  DATE SIGNED OUT: 10/16/2023 ELECTRONIC SIGNATURE : Jacelyn Grip, John, Pathologist, Electronic Signature  MICROSCOPIC DESCRIPTION  CASE COMMENTS STAINS USED IN  DIAGNOSIS: H&E-2 H&E-3 H&E-4 H&E *RECUT 1 SLIDE Universal Negative Control-DAB Universal Negative Control-DAB Universal Negative Control-DAB Stains used in diagnosis 1 Her2 by IHC, 1 ER-ACIS, 1 KI-67-ACIS, 1 PR-ACIS IHC scores are reported using ASCO/CAP scoring criteria. An IHC Score of 0 or 1+ is NEGATIVE for HER2, 3+ is POSITIVE for HER2, and 2+ is EQUIVOCAL. Equivocal results are reflexed to either FISH or IHC testing. Specimens are fixed in 10% Neutral Buffered Formalin for at least 6 hours and up to 72 hours. These tests have not be validated on decalcified tissue. Results should be interpreted with caution given the possibility of false negative results on decalcified specimens. Antibody Clone for HER2 is 4B5 ([facility]). Some of these immunohistochemical stains may have been developed and the performance characteristics determined by [facility]. Some may not have been cleared or approved by the U.S. Food and Drug Administration. The FDA has determined that such clearance or approval is not necessary. This test is used for clinical purposes. It should not be regarded as investigational or for research. This laboratory is certified under the Clinical Laboratory Improvement Amendments of 1988 (CLIA-88) as qualified to perform high complexity clinical laboratory testing. Estrogen receptor (6F11), immunohistochemical stains are performed on formalin fixed, paraffin embedded tissue using a 3,3"-diaminobenzidine (DAB) chromogen and Leica Bond Autostainer System. The staining intensity of the nucleus is scored manually and is reported as the percentage of tumor cell nuclei demonstrating specific nuclear staining.Specimens are fixed in 10% Neutral Buffered Formalin for at least 6 hours and up to 72 hours. These tests have not be validated on decalcified tissue. Results should be interpreted with caution given the possibility of false negative results on decalcified  specimens. Ki-67 (MM1), immunohistochemical stains are performed on formalin fixed, paraffin embedded tissue using a 3,3"-diaminobenzidine (DAB) chromogen and Leica Bond Autostainer System. The staining intensity of the nucleus is scored manually and is reported as the percentage of tumor cell nuclei demonstrating specific nuclear staining.Specimens are fixed in 10% Neutral Buffered Formalin for at least 6 hours and up to 72 hours. These tests have not be validated on decalcified tissue. Results should be interpreted with caution given the possibility of false negative results on decalcified specimens. PR progesterone receptor (16), immunohistochemical stains are performed on formalin fixed, paraffin embedded tissue using a 3,3"-diaminobenzidine (DAB) chromogen and Leica Bond Autostainer System. The staining intensity of the nucleus is scored manually and is reported as the percentage of tumor cell nuclei demonstrating specific nuclear staining.Specimens are fixed in 10% Neutral Buffered Formalin for at least 6 hours and up to 72 hours. These tests have not be validated on decalcified tissue. Results should be interpreted with caution given the possibility of false negative results on decalcified specimens.  ADDENDUM Breast, left, needle core biopsy, 12:00 3cmfn PROGNOSTIC INDICATORS  Results: IMMUNOHISTOCHEMICAL AND MORPHOMETRIC ANALYSIS PERFORMED MANUALLY The tumor cells are NEGATIVE for Her2 (1+). Estrogen Receptor: 100%, POSITIVE, STRONG STAINING INTENSITY Progesterone Receptor: 30%, POSITIVE, MODERATE STAINING INTENSITY Proliferation Marker Ki67: 1% REFERENCE RANGE ESTROGEN RECEPTOR NEGATIVE 0% POSITIVE =>1% REFERENCE RANGE PROGESTERONE RECEPTOR NEGATIVE 0% POSITIVE =>1% All controls stained appropriately Jacelyn Grip, John, Pathologist, Electronic Signature ( Signed [id number] [id number]) CLINICAL DATA: LEFT breast callback: History of RIGHT breast cancer status post  mastectomy  EXAM: DIGITAL DIAGNOSTIC UNILATERAL LEFT MAMMOGRAM WITH TOMOSYNTHESIS AND CAD; ULTRASOUND LEFT BREAST LIMITED  TECHNIQUE: Left digital diagnostic mammography and breast tomosynthesis was performed. The images were evaluated with computer-aided detection. ; Targeted ultrasound examination of the left breast was performed.  COMPARISON: Previous exam(s).  ACR Breast Density Category b: There are scattered areas of fibroglandular density.  FINDINGS: Spot compression tomosynthesis views demonstrate a persistent focal asymmetry with subtle architectural distortion. It is best seen on CC slice 30, MLO volume 1 slice 29 and MLO volume 2 slice 32.  Targeted ultrasound was performed of the LEFT upper outer breast. At 12 o'clock 3 cm from the nipple, there is an irregular hypoechoic mass with indistinct margins and posterior acoustic shadowing. It measures 4 x 5 x 4 mm. This is favored to correspond to the site of mammographic concern.  Targeted ultrasound was performed of the LEFT axilla. No suspicious axillary lymph nodes are visualized.  IMPRESSION: 1. There is a suspicious focal asymmetry/architectural distortion with a favored 5 mm sonographic correlate in the LEFT upper breast. Recommend ultrasound-guided biopsy for definitive characterization. Recommend attention on post marker placement mammogram to assess for mammographic/sonographic correlation. 2. No suspicious LEFT axillary adenopathy.  RECOMMENDATION: LEFT breast ultrasound-guided biopsy x1  I have discussed the findings and recommendations with the patient. The biopsy procedure was discussed with the patient and questions were answered. Patient expressed their understanding of the biopsy recommendation. Patient will be scheduled for biopsy at her earliest convenience by the schedulers. Ordering provider will be notified. If applicable, a reminder letter will be sent to the patient regarding the next  appointment.  BI-RADS CATEGORY 4: Suspicious.   Electronically Signed By: Meda Klinefelter M.D. On: 10/10/2023 10:43 Assessment and Plan:   Diagnoses and all orders for this visit:  Breast cancer, stage 1, estrogen receptor positive, left (CMS/HHS-HCC) - Ambulatory Referral to Radiation Oncology  Reviewed breast conserving surgery as well as mastectomy with reconstruction. Pros and cons of the procedure reviewed as well as expected long-term survival, local regional recurrence and additional therapies. She is opted for a left breast seed localized lumpectomy. She is seeing medical oncology and I will refer her to see radiation oncology as well. Plan will be for a seed localized left breast lumpectomy alone. Reviewed the sentinel lymph node data especially over the age of 63 for low-grade tumors and potential risk of lymphedema and shoulder pain as well as low yield of a note especially in light of normal imaging. Plan will be to omit a sentinel node unless needed by medical radiation oncology.The procedure has been discussed with the patient. Alternatives to surgery have been discussed with the patient. Risks of surgery include bleeding, Infection, Seroma formation, death, and the need for further surgery. The patient understands and wishes to proceed.    Hayden Rasmussen, MD

## 2023-11-14 NOTE — Transfer of Care (Signed)
 Immediate Anesthesia Transfer of Care Note  Patient: Katherine Stout  Procedure(s) Performed: BREAST LUMPECTOMY WITH RADIOACTIVE SEED LOCALIZATION (Left: Breast)  Patient Location: PACU  Anesthesia Type:General  Level of Consciousness: sedated  Airway & Oxygen Therapy: Patient Spontanous Breathing  Post-op Assessment: Report given to RN  Post vital signs: Reviewed and stable  Last Vitals:  Vitals Value Taken Time  BP 143/88 11/14/23 0821  Temp    Pulse 90 11/14/23 0824  Resp 14 11/14/23 0824  SpO2 99 % 11/14/23 0824  Vitals shown include unfiled device data.  Last Pain:  Vitals:   11/14/23 0640  TempSrc:   PainSc: 0-No pain         Complications: No notable events documented.

## 2023-11-14 NOTE — Discharge Instructions (Signed)

## 2023-11-14 NOTE — Interval H&P Note (Signed)
 History and Physical Interval Note:  11/14/2023 7:20 AM  Katherine Stout  has presented today for surgery, with the diagnosis of LEFT BREAST CANCER.  The various methods of treatment have been discussed with the patient and family. After consideration of risks, benefits and other options for treatment, the patient has consented to  Procedure(s): BREAST LUMPECTOMY WITH RADIOACTIVE SEED LOCALIZATION (Left) as a surgical intervention.  The patient's history has been reviewed, patient examined, no change in status, stable for surgery.  I have reviewed the patient's chart and labs.  Questions were answered to the patient's satisfaction.     [name] A Neale Marzette

## 2023-11-14 NOTE — Anesthesia Postprocedure Evaluation (Signed)
 Anesthesia Post Note  Patient: Quinnie Barcelo Hiebert  Procedure(s) Performed: BREAST LUMPECTOMY WITH RADIOACTIVE SEED LOCALIZATION (Left: Breast)     Patient location during evaluation: PACU Anesthesia Type: General Level of consciousness: awake and alert Pain management: pain level controlled Vital Signs Assessment: post-procedure vital signs reviewed and stable Respiratory status: spontaneous breathing, nonlabored ventilation and respiratory function stable Cardiovascular status: blood pressure returned to baseline Postop Assessment: no apparent nausea or vomiting Anesthetic complications: no   No notable events documented.  Last Vitals:  Vitals:   11/14/23 0845 11/14/23 0854  BP: (!) 138/92 (!) 147/85  Pulse: 72 69  Resp: 15 17  Temp:  36.5 C  SpO2: 98% 99%    Last Pain:  Vitals:   11/14/23 0854  TempSrc:   PainSc: 0-No pain                 Shanda Howells

## 2023-11-14 NOTE — Op Note (Signed)
 Preoperative diagnosis: Stage I left breast cancer upper-outer quadrant  ER positive      Postoperative diagnosis: Same      Procedure: Left breast seed localized lumpectomy   Surgeon: Harriette Bouillon M.D.    Anesthesia: LMA with pectoral block anesthesia   EBL: 20 cc   Specimen: Left breast mass with clipped and seed to pathology    Drains: None    Indications for procedure: Patient presents for treatment of her left breast cancer. She has opted for breast conservation after lengthy discussion of treatment options to include breast conservation surgery and mastectomy and reconstruction. Risks, benefits and alternatives discussed with the patient.The procedure has been discussed with the patient. Alternatives to surgery have been discussed with the patient. Risks of surgery include bleeding, Infection, Seroma formation, death, and the need for further surgery. The patient understands and wishes to proceed.The patient agrees to proceed.         Description of procedure: Patient underwent placement of left breast need a radiology earlier in the week. She presents to the holding area and questions are answered. Neoprobe was used to verify clip placement in the left breast. . Questions answered. Patient taken back to operating room and placed supine on the operating room table.  After induction of LMA anesthesia, left breast was prepped and draped in a sterile fashion  Of note, patient had pectoral block by anesthesia prior to this. Neoprobe was used to identify the radioactive seen in the left upper-outer quadrant. Curvilinear incision made and dissection was carried around to excise all tissue around both the clip and seed. Radiograph showed the mass/seed/clip  with gross negative margins. Both seed and clip were in the specimen. Specimen sent to pathology.   Wound was irrigated and closed with 3-0 Vicryl and 4-0 Monocryl.  Dermabond applied. All final counts sponge, needle  instruments found to be correct at this point. Patient awoke, taken to recovery in satisfactory condition.

## 2023-11-15 ENCOUNTER — Encounter (HOSPITAL_COMMUNITY): Payer: Self-pay | Admitting: Surgery

## 2023-11-16 LAB — SURGICAL PATHOLOGY

## 2023-11-17 ENCOUNTER — Encounter: Payer: Self-pay | Admitting: Surgery

## 2023-11-30 ENCOUNTER — Inpatient Hospital Stay: Admitting: Oncology

## 2023-11-30 VITALS — BP 128/86 | HR 96 | Temp 98.2°F | Resp 18 | Ht 64.0 in | Wt 146.4 lb

## 2023-11-30 DIAGNOSIS — C50812 Malignant neoplasm of overlapping sites of left female breast: Secondary | ICD-10-CM | POA: Diagnosis present

## 2023-11-30 DIAGNOSIS — Z170 Estrogen receptor positive status [ER+]: Secondary | ICD-10-CM

## 2023-11-30 DIAGNOSIS — C50412 Malignant neoplasm of upper-outer quadrant of left female breast: Secondary | ICD-10-CM

## 2023-11-30 MED ORDER — LETROZOLE 2.5 MG PO TABS: 2.5000 mg | ORAL_TABLET | Freq: Every day | ORAL | 1 refills | Status: DC

## 2023-11-30 NOTE — Progress Notes (Signed)
 Provided patient education material on Letrozole .

## 2023-11-30 NOTE — Progress Notes (Signed)
 [facility] OFFICE PROGRESS NOTE   Diagnosis: Breast cancer  INTERVAL HISTORY:   Katherine Stout underwent a seed localized left lumpectomy on 11/14/2023.  The pathology confirmed a 1.1 cm low-grade invasive ductal carcinoma with negative margins. She feels well.  Mild discomfort from surgery.  No new complaint.  Objective:  Vital signs in last 24 hours:  Blood pressure 128/86, pulse 96, temperature 98.2 F (36.8 C), temperature source Temporal, resp. rate 18, height 5\' 4"  (1.626 m), weight 146 lb 6.4 oz (66.4 kg), SpO2 100%.    Lymphatics: No cervical, supraclavicular, or axillary nodes Resp: Lungs clear bilaterally Cardio: Regular rate and rhythm GI: No hepatosplenomegaly Vascular: No leg edema Breasts: Right mastectomy with an implant in place.  Left lumpectomy with a healing incision/glue in place, ecchymosis over the left breast  Lab Results:  Lab Results  Component Value Date   WBC 8.7 11/06/2023   HGB 14.3 11/06/2023   HCT 41.9 11/06/2023   MCV 85.0 11/06/2023   PLT 266 11/06/2023    CMP  Lab Results  Component Value Date   NA 139 11/06/2023   K 3.4 (L) 11/06/2023   CL 103 11/06/2023   CO2 28 11/06/2023   GLUCOSE 93 11/06/2023   BUN 20 11/06/2023   CREATININE 0.93 11/06/2023   CALCIUM 10.5 (H) 11/06/2023   GFRNONAA >60 11/06/2023   GFRAA  01/19/2010    >60        The eGFR has been calculated using the MDRD equation. This calculation has not been validated in all clinical situations. eGFR's persistently <60 mL/min signify possible Chronic Kidney Disease.     Medications: I have reviewed the patient's current medications.   Assessment/Plan:  Left breast cancer: Clinical stage Ia (pT1c, PN X) 09/14/2023 screening mammogram-possible left breast mass 10/10/2023 diagnostic left mammogram/ultrasound: Persistent focal asymmetry with subtle architectural distortion, ultrasound revealed a mass at 12:00 3 cm from the nipple measuring 4 x 5 x 4  mm Ultrasound-guided biopsy and clip placement 10/13/2023: Invasive ductal carcinoma, grade 1, no lymphovascular invasion, 0.6 cm, ER 100%, PR 30%, Ki-67 1%, HER2 negative (1+) 11/14/2023 left lumpectomy: Invasive ductal carcinoma, grade 1 , 1.1 cm, negative margins, 0.5 cm posterior margin, no lymphovascular perineural invasion 11/30/2023: Letrozole  Right breast cancer 1997-status post a right mastectomy, adjuvant CMF, and 5 years of tamoxifen Family history of breast cancer-maternal aunt Hypertension Hysterectomy G3 P2, 1 miscarriage, first child at age 16, menopause at age 89 following chemotherapy Bilateral cataract surgery   Disposition: Katherine Stout has been diagnosed with stage I left-sided breast cancer.  We reviewed details of the surgical pathology report and discussed adjuvant treatment options.  She has a good prognosis for a long-term disease-free survival.  She remains at increased risk of developing another breast cancer based on her personal history of breast cancer, and the atypical ductal and lobular hyperplasia on the current resection specimen.  We discussed the indication for adjuvant radiation and hormonal therapy.  She does not wish to receive radiation.  She appears to be a candidate to forego radiation and receive adjuvant hormonal therapy.  I recommend letrozole .  She understands the goals of adjuvant therapy are to decrease the chance of developing recurrent breast cancer and decrease the risk of developing a new breast cancer.  We reviewed potential toxicities associated with letrozole  including the chance of hot flashes, arthralgias, decreased bone density, and alteration of the lipid profile.  She was given reading materials on letrozole .  She agrees to  proceed.  She will continue yearly mammography.  She will return for an office visit in 6 months.  The potassium level was mildly low and the calcium mildly increased on a chemistry panel 11/06/2023.  These changes are most  likely related to HCTZ therapy.  We will forward this result to Dr. Enrique Harvest.  Coni Deep, MD  11/30/2023  8:37 AM

## 2023-12-01 ENCOUNTER — Telehealth: Payer: Self-pay | Admitting: Oncology

## 2023-12-01 NOTE — Telephone Encounter (Signed)
 Patient has been scheduled for follow-up visit per 11/30/23 LOS.  Pt aware of scheduled appt details.

## 2024-02-28 ENCOUNTER — Other Ambulatory Visit: Payer: Self-pay | Admitting: Oncology

## 2024-05-28 ENCOUNTER — Other Ambulatory Visit: Payer: Self-pay | Admitting: Oncology

## 2024-05-30 ENCOUNTER — Inpatient Hospital Stay: Attending: Oncology | Admitting: Oncology

## 2024-05-30 VITALS — BP 128/80 | HR 96 | Temp 98.1°F | Resp 18 | Ht 64.0 in | Wt 147.7 lb

## 2024-05-30 DIAGNOSIS — C50412 Malignant neoplasm of upper-outer quadrant of left female breast: Secondary | ICD-10-CM | POA: Diagnosis not present

## 2024-05-30 DIAGNOSIS — Z170 Estrogen receptor positive status [ER+]: Secondary | ICD-10-CM | POA: Diagnosis not present

## 2024-05-30 DIAGNOSIS — C50911 Malignant neoplasm of unspecified site of right female breast: Secondary | ICD-10-CM | POA: Insufficient documentation

## 2024-05-30 DIAGNOSIS — C50912 Malignant neoplasm of unspecified site of left female breast: Secondary | ICD-10-CM | POA: Insufficient documentation

## 2024-05-30 DIAGNOSIS — Z79811 Long term (current) use of aromatase inhibitors: Secondary | ICD-10-CM | POA: Insufficient documentation

## 2024-05-30 DIAGNOSIS — Z803 Family history of malignant neoplasm of breast: Secondary | ICD-10-CM | POA: Insufficient documentation

## 2024-05-30 DIAGNOSIS — Z90710 Acquired absence of both cervix and uterus: Secondary | ICD-10-CM | POA: Diagnosis not present

## 2024-05-30 DIAGNOSIS — I10 Essential (primary) hypertension: Secondary | ICD-10-CM | POA: Insufficient documentation

## 2024-05-30 DIAGNOSIS — Z9011 Acquired absence of right breast and nipple: Secondary | ICD-10-CM | POA: Insufficient documentation

## 2024-05-30 NOTE — Progress Notes (Signed)
  [facility] OFFICE PROGRESS NOTE   Diagnosis: Breast cancer  INTERVAL HISTORY:   Katherine Stout returns as scheduled.  She continues letrozole .  No hot flashes or arthralgias.  No complaint.  Objective:  Vital signs in last 24 hours:  Blood pressure 128/80, pulse 96, temperature 98.1 F (36.7 C), temperature source Temporal, resp. rate 18, height 5' 4 (1.626 m), weight 147 lb 11.2 oz (67 kg), SpO2 99%.     Lymphatics: No cervical, supraclavicular, or axillary nodes Resp: Lungs clear bilaterally Cardio: Regular rate and rhythm GI: No hepatosplenomegaly Vascular: No leg edema Breast: Right mastectomy with an implant in place.  No evidence for chest wall tumor recurrence.  Left lumpectomy.  No evidence of local tumor recurrence.  Left breast without mass.   Lab Results:  Lab Results  Component Value Date   WBC 8.7 11/06/2023   HGB 14.3 11/06/2023   HCT 41.9 11/06/2023   MCV 85.0 11/06/2023   PLT 266 11/06/2023    CMP  Lab Results  Component Value Date   NA 139 11/06/2023   K 3.4 (L) 11/06/2023   CL 103 11/06/2023   CO2 28 11/06/2023   GLUCOSE 93 11/06/2023   BUN 20 11/06/2023   CREATININE 0.93 11/06/2023   CALCIUM 10.5 (H) 11/06/2023   GFRNONAA >60 11/06/2023   GFRAA  01/19/2010    >60        The eGFR has been calculated using the MDRD equation. This calculation has not been validated in all clinical situations. eGFR's persistently <60 mL/min signify possible Chronic Kidney Disease.    No results found for: CEA1, CEA, CAN199, CA125  No results found for: INR, LABPROT  Imaging:  No results found.  Medications: I have reviewed the patient's current medications.   Assessment/Plan:  Left breast cancer: Clinical stage Ia (pT1c, PN X) 09/14/2023 screening mammogram-possible left breast mass 10/10/2023 diagnostic left mammogram/ultrasound: Persistent focal asymmetry with subtle architectural distortion, ultrasound revealed a mass  at 12:00 3 cm from the nipple measuring 4 x 5 x 4 mm Ultrasound-guided biopsy and clip placement 10/13/2023: Invasive ductal carcinoma, grade 1, no lymphovascular invasion, 0.6 cm, ER 100%, PR 30%, Ki-67 1%, HER2 negative (1+) 11/14/2023 left lumpectomy: Invasive ductal carcinoma, grade 1 , 1.1 cm, negative margins, 0.5 cm posterior margin, no lymphovascular perineural invasion 11/30/2023: Letrozole  Right breast cancer 1997-status post a right mastectomy, adjuvant CMF, and 5 years of tamoxifen Family history of breast cancer-maternal aunt Hypertension Hysterectomy G3 P2, 1 miscarriage, first child at age 59, menopause at age 56 following chemotherapy Bilateral cataract surgery   Disposition: Katherine Stout is in clinical remission from breast cancer.  She appears to be tolerating the adjuvant letrozole  well.  She will continue letrozole .  She will be scheduled for a mammogram in March 2026.  She will return for an office visit in 6 months.  She is followed by gynecology for management of osteopenia and bone density scans.  Arley Hof, MD  05/30/2024  10:13 AM

## 2024-08-27 ENCOUNTER — Other Ambulatory Visit: Payer: Self-pay | Admitting: Oncology

## 2024-10-23 ENCOUNTER — Encounter

## 2024-11-28 ENCOUNTER — Inpatient Hospital Stay: Admitting: Oncology
